# Patient Record
Sex: Male | Born: 1960 | Race: White | Hispanic: No | Marital: Married | Smoking: Never smoker
Health system: Southern US, Community
[De-identification: ages and names within clinical notes are randomized; demographics above are authoritative.]

## PROBLEM LIST (undated history)

## (undated) DIAGNOSIS — I10 Essential (primary) hypertension: Secondary | ICD-10-CM

## (undated) DIAGNOSIS — N200 Calculus of kidney: Secondary | ICD-10-CM

## (undated) DIAGNOSIS — I509 Heart failure, unspecified: Secondary | ICD-10-CM

## (undated) HISTORY — DX: Essential (primary) hypertension: I10

## (undated) HISTORY — PX: LITHOTRIPSY: SUR834

## (undated) HISTORY — PX: LUMBAR DISC SURGERY: SHX700

## (undated) HISTORY — DX: Heart failure, unspecified: I50.9

---

## 2002-08-25 ENCOUNTER — Ambulatory Visit (HOSPITAL_BASED_OUTPATIENT_CLINIC_OR_DEPARTMENT_OTHER): Admission: RE | Admit: 2002-08-25 | Discharge: 2002-08-25 | Payer: Self-pay | Admitting: Urology

## 2002-08-25 ENCOUNTER — Encounter: Payer: Self-pay | Admitting: Urology

## 2002-08-29 ENCOUNTER — Encounter: Payer: Self-pay | Admitting: Urology

## 2002-08-29 ENCOUNTER — Ambulatory Visit (HOSPITAL_BASED_OUTPATIENT_CLINIC_OR_DEPARTMENT_OTHER): Admission: RE | Admit: 2002-08-29 | Discharge: 2002-08-29 | Payer: Self-pay | Admitting: Urology

## 2002-09-01 ENCOUNTER — Ambulatory Visit (HOSPITAL_BASED_OUTPATIENT_CLINIC_OR_DEPARTMENT_OTHER): Admission: RE | Admit: 2002-09-01 | Discharge: 2002-09-01 | Payer: Self-pay | Admitting: Urology

## 2002-09-01 ENCOUNTER — Encounter: Payer: Self-pay | Admitting: Urology

## 2013-05-06 ENCOUNTER — Ambulatory Visit: Payer: Self-pay | Admitting: Internal Medicine

## 2013-07-21 ENCOUNTER — Ambulatory Visit: Payer: Self-pay | Admitting: Surgery

## 2015-04-06 NOTE — Op Note (Signed)
PATIENT NAME:  Philip Romero, [name] MR#:  [id number] DATE OF BIRTH:  1961/12/12  DATE OF PROCEDURE:  07/21/2013  PREOPERATIVE DIAGNOSIS: Left inguinal hernia.   POSTOPERATIVE DIAGNOSIS: Left inguinal hernia.   PROCEDURE: Left inguinal hernia repair.   SURGEON: Renda RollsWilton Smith, MD   ANESTHESIA: General.   INDICATIONS: This 54 year old male has had recent burning and bulging in the left groin, first developed some 2 weeks to prior evaluation, had been doing some strenuous calisthenics. A hernia was demonstrated on physical exam, and repair is recommended for definitive treatment.   DESCRIPTION OF PROCEDURE: The patient was placed on the operating table in the supine position under general anesthesia. The left lower quadrant of the abdomen was clipped and prepared with ChloraPrep and draped in a sterile manner.   A left lower quadrant transversely-oriented suprapubic incision was made, carried down through subcutaneous tissues. One traversing vein was divided between 4-0 chromic suture ligatures. The Scarpa's fascia was incised. Several small bleeding points were cauterized. The external ring was identified. An incision was made in the external oblique aponeurosis along the course of its fibers to open the external ring and expose the inguinal cord structures. The cord structures were mobilized. The floor of the inguinal canal was intact. Cremaster fibers were spread to expose an indirect hernia sac which was approximately 4 inches in length and was dissected up through the internal ring.  It appeared that it was a sliding type hernia and was inverted. Next, there was a cord lipoma which was dissected free from surrounding structures and its pedicle ligated with 4-0 Vicryl suture ligature and amputated, did not need to be sent for pathology. Next, sutures were placed to join the conjoined tendon and the shelving edge of the inguinal ligament beginning at the pubic tubercle with the last stitch leading to  satisfactory narrowing of the internal ring. Next, an [place] [employer] mesh was cut out to create an oval shape of some 2.8 x 4 cm with a notch cut out to straddle the cord structures. The mesh was inserted, was sutured with 0 Surgilon to the shelving edge of the inguinal ligament adjacent to the internal ring and also medial to the internal ring. The mesh was attached to the suture line with additional 0 Surgilon and also sutured medially to the deep fascia. The repair looked good. Hemostasis was intact. Cord structures were replaced along the floor of the anal canal. Cut edges of the external oblique aponeurosis were closed with a running 4-0 Vicryl. The deep fascia superior and lateral to the repair site was infiltrated with 0.5% Sensorcaine with epinephrine. Subcutaneous tissues were infiltrated using a total of 15 mL. The Scarpa's fascia was closed with interrupted 4-0 Vicryl. The skin was closed with running 4-0 Monocryl subcuticular suture and Dermabond. The patient tolerated surgery satisfactorily and was then prepared for transfer to the recovery room.   ____________________________ Shela CommonsJ. Renda RollsWilton Smith, MD jws:cb D: 07/21/2013 13:42:42 ET T: 07/21/2013 21:31:09 ET JOB#: [id number]  cc: Adella HareJ. Wilton Smith, MD, <Dictator> Adella HareWILTON J SMITH MD ELECTRONICALLY SIGNED 07/22/2013 17:52

## 2020-02-29 ENCOUNTER — Emergency Department
Admission: EM | Admit: 2020-02-29 | Discharge: 2020-02-29 | Disposition: A | Discharge status: Home / Self Care | Payer: BC Managed Care – PPO | Attending: Emergency Medicine | Admitting: Emergency Medicine

## 2020-02-29 ENCOUNTER — Other Ambulatory Visit: Payer: Self-pay

## 2020-02-29 ENCOUNTER — Emergency Department: Payer: BC Managed Care – PPO

## 2020-02-29 DIAGNOSIS — F17220 Nicotine dependence, chewing tobacco, uncomplicated: Secondary | ICD-10-CM | POA: Insufficient documentation

## 2020-02-29 DIAGNOSIS — Z79899 Other long term (current) drug therapy: Secondary | ICD-10-CM | POA: Insufficient documentation

## 2020-02-29 DIAGNOSIS — Z20822 Contact with and (suspected) exposure to covid-19: Secondary | ICD-10-CM | POA: Diagnosis not present

## 2020-02-29 DIAGNOSIS — N200 Calculus of kidney: Secondary | ICD-10-CM | POA: Insufficient documentation

## 2020-02-29 DIAGNOSIS — R109 Unspecified abdominal pain: Secondary | ICD-10-CM | POA: Diagnosis present

## 2020-02-29 HISTORY — DX: Calculus of kidney: N20.0

## 2020-02-29 LAB — CBC
HCT: 41.8 % (ref 39.0–52.0)
Hemoglobin: 14.9 g/dL (ref 13.0–17.0)
MCH: 31.6 pg (ref 26.0–34.0)
MCHC: 35.6 g/dL (ref 30.0–36.0)
MCV: 88.6 fL (ref 80.0–100.0)
Platelets: 254 10*3/uL (ref 150–400)
RBC: 4.72 MIL/uL (ref 4.22–5.81)
RDW: 12.1 % (ref 11.5–15.5)
WBC: 7.8 10*3/uL (ref 4.0–10.5)
nRBC: 0 % (ref 0.0–0.2)

## 2020-02-29 LAB — BASIC METABOLIC PANEL
Anion gap: 7 (ref 5–15)
BUN: 22 mg/dL — ABNORMAL HIGH (ref 6–20)
CO2: 30 mmol/L (ref 22–32)
Calcium: 9.5 mg/dL (ref 8.9–10.3)
Chloride: 101 mmol/L (ref 98–111)
Creatinine, Ser: 0.87 mg/dL (ref 0.61–1.24)
GFR calc Af Amer: >60 mL/min (ref >60)
GFR calc non Af Amer: >60 mL/min (ref >60)
Glucose, Bld: 157 mg/dL — ABNORMAL HIGH (ref 70–99)
Potassium: 3.9 mmol/L (ref 3.5–5.1)
Sodium: 138 mmol/L (ref 135–145)

## 2020-02-29 LAB — URINALYSIS, COMPLETE (UACMP) WITH MICROSCOPIC
Bacteria, UA: NONE SEEN
Bilirubin Urine: NEGATIVE
Glucose, UA: NEGATIVE mg/dL
Ketones, ur: NEGATIVE mg/dL
Nitrite: NEGATIVE
Protein, ur: NEGATIVE mg/dL
Specific Gravity, Urine: 1.018 (ref 1.005–1.030)
pH: 5 (ref 5.0–8.0)

## 2020-02-29 MED ORDER — OXYCODONE-ACETAMINOPHEN 5-325 MG PO TABS: 1.0000 | ORAL_TABLET | Freq: Four times a day (QID) | ORAL | 0 refills | Status: DC | PRN

## 2020-02-29 MED ORDER — ONDANSETRON 4 MG PO TBDP: 4.0000 mg | ORAL_TABLET | Freq: Three times a day (TID) | ORAL | 0 refills | Status: DC | PRN

## 2020-02-29 MED ORDER — ONDANSETRON 8 MG PO TBDP
8.0000 mg | ORAL_TABLET | Freq: Once | ORAL | Status: AC
  Administered 2020-02-29: 8 mg via ORAL
  Filled 2020-02-29: qty 1

## 2020-02-29 MED ORDER — OXYCODONE-ACETAMINOPHEN 5-325 MG PO TABS
1.0000 | ORAL_TABLET | ORAL | Status: DC | PRN
  Administered 2020-02-29: 1 via ORAL
  Filled 2020-02-29: qty 1

## 2020-02-29 MED ORDER — TAMSULOSIN HCL 0.4 MG PO CAPS: 0.4000 mg | ORAL_CAPSULE | Freq: Every day | ORAL | 0 refills | Status: DC

## 2020-02-29 MED ORDER — OXYCODONE-ACETAMINOPHEN 5-325 MG PO TABS
1.0000 | ORAL_TABLET | Freq: Once | ORAL | Status: AC
  Administered 2020-02-29: 1 via ORAL
  Filled 2020-02-29: qty 1

## 2020-02-29 NOTE — ED Triage Notes (Signed)
Pt c/o right flank pain radiating into the RLQ  With N/V.Marland Kitchensince last Wednesday with a hx of kidney stones. Pt is in NAD at present.

## 2020-02-29 NOTE — ED Provider Notes (Signed)
[facility] Emergency Department Provider Note  ____________________________________________  Time seen: Approximately 4:43 PM  I have reviewed the triage vital signs and the nursing notes.   HISTORY  Chief Complaint Flank Pain    HPI Philip Romero is a 59 y.o. male who presents the emergency department for evaluation right flank pain.  Patient states that he has a history of kidney stones, however the last time was approximately 15 years ago.  Patient states that he developed flank pain that is now radiating into the right groin.  Positive for hematuria.  Patient states that he has had nausea from the pain, however he is not nauseated when the pain is not bad.  No diarrhea or constipation.  Other than hematuria, no dysuria or polyuria.  Patient states that he has a history of lithotripsy has the stones 15 years ago were too large to pass.  Patient is concerned as he has had symptoms now for 6 days.  No other complaints of URI type symptoms, fevers or chills, chest pain or shortness of breath.         Past Medical History:  Diagnosis Date  . Kidney stone     There are no problems to display for this patient.   Past Surgical History:  Procedure Laterality Date  . LITHOTRIPSY    . LUMBAR DISC SURGERY      Prior to Admission medications   Medication Sig Start Date End Date Taking? Authorizing Provider  ondansetron (ZOFRAN-ODT) 4 MG disintegrating tablet Take 1 tablet (4 mg total) by mouth every 8 (eight) hours as needed for nausea or vomiting. 02/29/20   Pablo Mathurin, Delorise Royals, PA-C  oxyCODONE-acetaminophen (PERCOCET/ROXICET) 5-325 MG tablet Take 1 tablet by mouth every 6 (six) hours as needed for severe pain. 02/29/20   Emmarie Sannes, Delorise Royals, PA-C  tamsulosin (FLOMAX) 0.4 MG CAPS capsule Take 1 capsule (0.4 mg total) by mouth daily. 02/29/20   Koi Yarbro, Delorise Royals, PA-C    Allergies Patient has no known allergies.  No family history on file.  Social  History Social History   Tobacco Use  . Smoking status: Never Smoker  . Smokeless tobacco: Current User    Types: Chew  Substance Use Topics  . Alcohol use: Not Currently  . Drug use: Not Currently     Review of Systems  Constitutional: No fever/chills Eyes: No visual changes. No discharge ENT: No upper respiratory complaints. Cardiovascular: no chest pain. Respiratory: no cough. No SOB. Gastrointestinal: No abdominal pain.  Positive for nausea with increased pain, no baseline nausea, no vomiting.  No diarrhea.  No constipation. Genitourinary: Negative for dysuria.  Positive for right flank pain radiating into the right groin.  Positive for hematuria. Musculoskeletal: Negative for musculoskeletal pain. Skin: Negative for rash, abrasions, lacerations, ecchymosis. Neurological: Negative for headaches, focal weakness or numbness. 10-point ROS otherwise negative.  ____________________________________________   PHYSICAL EXAM:  VITAL SIGNS: ED Triage Vitals  Enc Vitals Group     BP 02/29/20 1436 (!) 161/95     Pulse Rate 02/29/20 1434 87     Resp 02/29/20 1434 17     Temp 02/29/20 1436 98 F (36.7 C)     Temp Source 02/29/20 1434 Oral     SpO2 02/29/20 1434 98 %     Weight 02/29/20 1435 265 lb (120.2 kg)     Height 02/29/20 1435 6\' 2"  (1.88 m)     Head Circumference --      Peak Flow --  Pain Score 02/29/20 1434 3     Pain Loc --      Pain Edu? --      Excl. in [place]? --      Constitutional: Alert and oriented. Well appearing and in no acute distress. Eyes: Conjunctivae are normal. PERRL. EOMI. Head: Atraumatic. ENT:      Ears:       Nose: No congestion/rhinnorhea.      Mouth/Throat: Mucous membranes are moist.  Neck: No stridor.    Cardiovascular: Normal rate, regular rhythm. Normal S1 and S2.  Good peripheral circulation. Respiratory: Normal respiratory effort without tachypnea or retractions. Lungs CTAB. Good air entry to the bases with no decreased or  absent breath sounds. Gastrointestinal: Bowel sounds 4 quadrants. Soft and nontender to palpation all 4 quadrants including suprapubic region. No guarding or rigidity. No palpable masses. No distention. No CVA tenderness. Musculoskeletal: Full range of motion to all extremities. No gross deformities appreciated. Neurologic:  Normal speech and language. No gross focal neurologic deficits are appreciated.  Skin:  Skin is warm, dry and intact. No rash noted. Psychiatric: Mood and affect are normal. Speech and behavior are normal. Patient exhibits appropriate insight and judgement.   ____________________________________________   LABS (all labs ordered are listed, but only abnormal results are displayed)  Labs Reviewed  URINALYSIS, COMPLETE (UACMP) WITH MICROSCOPIC - Abnormal; Notable for the following components:      Result Value   Color, Urine YELLOW (*)    APPearance CLEAR (*)    Hgb urine dipstick MODERATE (*)    Leukocytes,Ua TRACE (*)    All other components within normal limits  BASIC METABOLIC PANEL - Abnormal; Notable for the following components:   Glucose, Bld 157 (*)    BUN 22 (*)    All other components within normal limits  SARS CORONAVIRUS 2 (TAT 6-24 HRS)  CBC   ____________________________________________  EKG   ____________________________________________  RADIOLOGY I personally viewed and evaluated these images as part of my medical decision making, as well as reviewing the written report by the radiologist.  I concur with radiologist finding of obstructing stone in the right ureter with hydronephrosis.  Stones appreciated in bilateral kidneys.  CT Renal Stone Study  Result Date: 02/29/2020 CLINICAL DATA:  Right-sided flank pain. EXAM: CT ABDOMEN AND PELVIS WITHOUT CONTRAST TECHNIQUE: Multidetector CT imaging of the abdomen and pelvis was performed following the standard protocol without IV contrast. COMPARISON:  None. FINDINGS: Lower chest: The lung bases are  clear. The heart size is normal. Hepatobiliary: There is decreased hepatic attenuation suggestive of hepatic steatosis. Cholelithiasis without acute inflammation.There is no biliary ductal dilation. Pancreas: Normal contours without ductal dilatation. No peripancreatic fluid collection. Spleen: No splenic laceration or hematoma. Adrenals/Urinary Tract: --Adrenal glands: No adrenal hemorrhage. --Right kidney/ureter: There is mild to moderate right-sided hydronephrosis secondary to an obstructing stone in the mid right ureter measuring approximately 6 mm (axial series 2, image 59). There are additional nonobstructing stones in the right kidney measuring up to approximately 6 mm in the lower pole. --Left kidney/ureter: Multiple nonobstructing stones are noted in the left kidney measuring up to approximately 1.3 cm. --Urinary bladder: Unremarkable. Stomach/Bowel: --Stomach/Duodenum: No hiatal hernia or other gastric abnormality. Normal duodenal course and caliber. --Small bowel: No dilatation or inflammation. --Colon: There is scattered colonic diverticula without CT evidence of diverticulitis. --Appendix: Normal. Vascular/Lymphatic: Atherosclerotic calcification is present within the non-aneurysmal abdominal aorta, without hemodynamically significant stenosis. --No retroperitoneal lymphadenopathy. --No mesenteric lymphadenopathy. --No pelvic or inguinal lymphadenopathy.  Reproductive: Unremarkable Other: No ascites or free air. There are bilateral fat containing inguinal hernias. Musculoskeletal. No acute displaced fractures. IMPRESSION: 1. Moderate right-sided hydronephrosis secondary to an obstructing 6 mm stone in the mid right ureter. 2. Bilateral nonobstructing nephrolithiasis as detailed above. 3. Hepatic steatosis. 4. Scattered colonic diverticula without CT evidence for diverticulitis. Normal appendix in the right lower quadrant. 5.  Aortic Atherosclerosis (ICD10-I70.0). Electronically Signed   By: Katherine Mantle M.D.   On: 02/29/2020 17:32    ____________________________________________    PROCEDURES  Procedure(s) performed:    Procedures    Medications  oxyCODONE-acetaminophen (PERCOCET/ROXICET) 5-325 MG per tablet 1 tablet (1 tablet Oral Given 02/29/20 1450)  oxyCODONE-acetaminophen (PERCOCET/ROXICET) 5-325 MG per tablet 1 tablet (1 tablet Oral Given 02/29/20 1923)  ondansetron (ZOFRAN-ODT) disintegrating tablet 8 mg (8 mg Oral Given 02/29/20 1923)     ____________________________________________   INITIAL IMPRESSION / ASSESSMENT AND PLAN / ED COURSE  Pertinent labs & imaging results that were available during my care of the patient were reviewed by me and considered in my medical decision making (see chart for details).  Review of the [place] CSRS was performed in accordance of the [facility] prior to dispensing any controlled drugs.           Patient's diagnosis is consistent with right side obstructing nephrolithiasis.  Patient presented to the emergency department with 1 week of right-sided flank pain.  Patient does have a history of remote nephrolithiasis that required lithotripsy.  This occurred roughly 15 years ago.  On exam, differential included pyelonephritis, appendicitis, cholecystitis, nephrolithiasis, obstructing nephrolithiasis.  CT scan reveals 6 mm stone lodged mid ureter.  I discussed the case with on-call urologist, Dr. Apolinar Junes.  Patient will have lithotripsy tomorrow.  Patient is discharged with pain medication, antimedics, Flomax for use after lithotripsy.  Patient will follow up with urology in the morning for lithotripsy.  Return precautions discussed with the patient.   Patient is given ED precautions to return to the ED for any worsening or new symptoms.     ____________________________________________  FINAL CLINICAL IMPRESSION(S) / ED DIAGNOSES  Final diagnoses:  Nephrolithiasis      NEW MEDICATIONS STARTED DURING THIS VISIT:  ED Discharge Orders          Ordered    oxyCODONE-acetaminophen (PERCOCET/ROXICET) 5-325 MG tablet  Every 6 hours PRN     02/29/20 1917    ondansetron (ZOFRAN-ODT) 4 MG disintegrating tablet  Every 8 hours PRN     02/29/20 1917    tamsulosin (FLOMAX) 0.4 MG CAPS capsule  Daily     02/29/20 1917              This chart was dictated using voice recognition software/Dragon. Despite best efforts to proofread, errors can occur which can change the meaning. Any change was purely unintentional.    Racheal Patches, PA-C 02/29/20 1936    Sharman Cheek, MD 02/29/20 2253

## 2020-03-01 ENCOUNTER — Ambulatory Visit
Admission: AD | Admit: 2020-03-01 | Discharge: 2020-03-01 | Disposition: A | Discharge status: Home / Self Care | Payer: BC Managed Care – PPO | Source: Ambulatory Visit | Attending: Urology | Admitting: Urology

## 2020-03-01 ENCOUNTER — Other Ambulatory Visit: Payer: Self-pay | Admitting: Urology

## 2020-03-01 ENCOUNTER — Ambulatory Visit (INDEPENDENT_AMBULATORY_CARE_PROVIDER_SITE_OTHER): Payer: Self-pay | Admitting: Urology

## 2020-03-01 ENCOUNTER — Encounter: Payer: Self-pay | Admitting: Urology

## 2020-03-01 ENCOUNTER — Ambulatory Visit: Admit: 2020-03-01 | Payer: BC Managed Care – PPO | Admitting: Urology

## 2020-03-01 ENCOUNTER — Other Ambulatory Visit: Payer: Self-pay

## 2020-03-01 ENCOUNTER — Encounter
Admission: AD | Disposition: A | Discharge status: Home / Self Care | Payer: Self-pay | Source: Ambulatory Visit | Attending: Urology

## 2020-03-01 VITALS — BP 151/100 | HR 81 | Ht 74.0 in | Wt 263.0 lb

## 2020-03-01 DIAGNOSIS — K760 Fatty (change of) liver, not elsewhere classified: Secondary | ICD-10-CM | POA: Insufficient documentation

## 2020-03-01 DIAGNOSIS — N201 Calculus of ureter: Secondary | ICD-10-CM

## 2020-03-01 DIAGNOSIS — Z87442 Personal history of urinary calculi: Secondary | ICD-10-CM | POA: Diagnosis not present

## 2020-03-01 DIAGNOSIS — N200 Calculus of kidney: Secondary | ICD-10-CM

## 2020-03-01 DIAGNOSIS — Z79899 Other long term (current) drug therapy: Secondary | ICD-10-CM | POA: Insufficient documentation

## 2020-03-01 DIAGNOSIS — K5730 Diverticulosis of large intestine without perforation or abscess without bleeding: Secondary | ICD-10-CM | POA: Diagnosis not present

## 2020-03-01 DIAGNOSIS — I700 Atherosclerosis of aorta: Secondary | ICD-10-CM | POA: Diagnosis not present

## 2020-03-01 DIAGNOSIS — N132 Hydronephrosis with renal and ureteral calculous obstruction: Secondary | ICD-10-CM | POA: Insufficient documentation

## 2020-03-01 DIAGNOSIS — R109 Unspecified abdominal pain: Secondary | ICD-10-CM

## 2020-03-01 HISTORY — PX: EXTRACORPOREAL SHOCK WAVE LITHOTRIPSY: SHX1557

## 2020-03-01 LAB — SARS CORONAVIRUS 2 (TAT 6-24 HRS): SARS Coronavirus 2: NEGATIVE

## 2020-03-01 SURGERY — LITHOTRIPSY, ESWL
Anesthesia: Choice | Laterality: Right

## 2020-03-01 SURGERY — LITHOTRIPSY, ESWL
Anesthesia: Moderate Sedation | Laterality: Right

## 2020-03-01 MED ORDER — CIPROFLOXACIN HCL 500 MG PO TABS
ORAL_TABLET | ORAL | Status: AC
  Administered 2020-03-01: 500 mg via ORAL
  Filled 2020-03-01: qty 1

## 2020-03-01 MED ORDER — DIAZEPAM 5 MG PO TABS
ORAL_TABLET | ORAL | Status: AC
  Administered 2020-03-01: 10 mg via ORAL
  Filled 2020-03-01: qty 2

## 2020-03-01 MED ORDER — DIPHENHYDRAMINE HCL 25 MG PO CAPS
ORAL_CAPSULE | ORAL | Status: AC
  Administered 2020-03-01: 25 mg via ORAL
  Filled 2020-03-01: qty 1

## 2020-03-01 MED ORDER — DIPHENHYDRAMINE HCL 25 MG PO CAPS: 25.0000 mg | ORAL_CAPSULE | ORAL | Status: AC

## 2020-03-01 MED ORDER — ONDANSETRON HCL 4 MG/2ML IJ SOLN
INTRAMUSCULAR | Status: AC
  Administered 2020-03-01: 4 mg via INTRAVENOUS
  Filled 2020-03-01: qty 2

## 2020-03-01 MED ORDER — ONDANSETRON HCL 4 MG/2ML IJ SOLN: 4.0000 mg | Freq: Once | INTRAMUSCULAR | Status: AC

## 2020-03-01 MED ORDER — CIPROFLOXACIN HCL 500 MG PO TABS: 500.0000 mg | ORAL_TABLET | ORAL | Status: AC

## 2020-03-01 MED ORDER — DIAZEPAM 5 MG PO TABS: 10.0000 mg | ORAL_TABLET | ORAL | Status: AC

## 2020-03-01 MED ORDER — SODIUM CHLORIDE 0.9 % IV SOLN: INTRAVENOUS | Status: DC

## 2020-03-01 NOTE — Discharge Instructions (Signed)
See [facility] discharge instructions in chart.  

## 2020-03-01 NOTE — Progress Notes (Signed)
03/01/2020 9:39 AM   Philip Romero [birth year] [id number]  Referring provider: No referring provider defined for this encounter.  Chief Complaint  Patient presents with  . Nephrolithiasis    HPI: 59 yo M with personal history of kidney stones who presented to the emergency see room last night with worsening right flank pain.    CT revealed a 6 mm right mid ureteral calculus with proximal hydroureteronephrosis. Is additional nonobstructing stones, left greater than right measuring up to 1.3 cm.  He does have a personal history of nephrolithiasis and undergone successful ESWL in the past.  He denies any fevers, chills, nausea or vomiting. No dysuria or gross hematuria.  In the emergency room, no evidence of infection or leukocytosis. Creatinine unremarkable.  PMH: Past Medical History:  Diagnosis Date  . Kidney stone     Surgical History: Past Surgical History:  Procedure Laterality Date  . LITHOTRIPSY    . LUMBAR DISC SURGERY      Home Medications:  Allergies as of 03/01/2020   No Known Allergies     Medication List       Accurate as of March 01, 2020  9:17 AM. If you have any questions, ask your nurse or doctor.        ondansetron 4 MG disintegrating tablet Commonly known as: ZOFRAN-ODT Take 1 tablet (4 mg total) by mouth every 8 (eight) hours as needed for nausea or vomiting.   oxyCODONE-acetaminophen 5-325 MG tablet Commonly known as: PERCOCET/ROXICET Take 1 tablet by mouth every 6 (six) hours as needed for severe pain.   tamsulosin 0.4 MG Caps capsule Commonly known as: FLOMAX Take 1 capsule (0.4 mg total) by mouth daily.       Allergies: No Known Allergies  Family History: No family history on file.  Social History:  reports that he has never smoked. His smokeless tobacco use includes chew. He reports previous alcohol use. He reports previous drug use.   Physical Exam: BP (!) 151/100   Pulse 81   Ht 6\' 2"  (1.88 m)   Wt 263 lb (119.3  kg)   BMI 33.77 kg/m   Constitutional:  Alert and oriented, No acute distress. HEENT: [place] AT, moist mucus membranes.  Trachea midline, no masses. Cardiovascular: No clubbing, cyanosis, or edema. Respiratory: Normal respiratory effort, no increased work of breathing. GI: Abdomen is soft, nontender, nondistended, no abdominal masses GU: No CVA tenderness Skin: No rashes, bruises or suspicious lesions. Neurologic: Grossly intact, no focal deficits, moving all 4 extremities. Psychiatric: Normal mood and affect.  Laboratory Data: Lab Results  Component Value Date   WBC 7.8 02/29/2020   HGB 14.9 02/29/2020   HCT 41.8 02/29/2020   MCV 88.6 02/29/2020   PLT 254 02/29/2020    Lab Results  Component Value Date   CREATININE 0.87 02/29/2020    Urinalysis Component     Latest Ref Rng & Units 02/29/2020  Color, Urine     YELLOW YELLOW (A)  Appearance     CLEAR CLEAR (A)  Specific Gravity, Urine     1.005 - 1.030 1.018  pH     5.0 - 8.0 5.0  Glucose, UA     NEGATIVE mg/dL NEGATIVE  Hgb urine dipstick     NEGATIVE MODERATE (A)  Bilirubin Urine     NEGATIVE NEGATIVE  Ketones, ur     NEGATIVE mg/dL NEGATIVE  Protein     NEGATIVE mg/dL NEGATIVE  Nitrite     NEGATIVE NEGATIVE  Leukocytes,Ua  NEGATIVE TRACE (A)  RBC / HPF     0 - 5 RBC/hpf 6-10  WBC, UA     0 - 5 WBC/hpf 11-20  Bacteria, UA     NONE SEEN NONE SEEN  Squamous Epithelial / LPF     0 - 5 0-5  Mucus      PRESENT    Pertinent Imaging: Results for orders placed during the hospital encounter of 02/29/20  CT Renal Stone Study   Narrative CLINICAL DATA:  Right-sided flank pain.  EXAM: CT ABDOMEN AND PELVIS WITHOUT CONTRAST  TECHNIQUE: Multidetector CT imaging of the abdomen and pelvis was performed following the standard protocol without IV contrast.  COMPARISON:  None.  FINDINGS: Lower chest: The lung bases are clear. The heart size is normal.  Hepatobiliary: There is decreased hepatic attenuation  suggestive of hepatic steatosis. Cholelithiasis without acute inflammation.There is no biliary ductal dilation.  Pancreas: Normal contours without ductal dilatation. No peripancreatic fluid collection.  Spleen: No splenic laceration or hematoma.  Adrenals/Urinary Tract:  --Adrenal glands: No adrenal hemorrhage.  --Right kidney/ureter: There is mild to moderate right-sided hydronephrosis secondary to an obstructing stone in the mid right ureter measuring approximately 6 mm (axial series 2, image 59). There are additional nonobstructing stones in the right kidney measuring up to approximately 6 mm in the lower pole.  --Left kidney/ureter: Multiple nonobstructing stones are noted in the left kidney measuring up to approximately 1.3 cm.  --Urinary bladder: Unremarkable.  Stomach/Bowel:  --Stomach/Duodenum: No hiatal hernia or other gastric abnormality. Normal duodenal course and caliber.  --Small bowel: No dilatation or inflammation.  --Colon: There is scattered colonic diverticula without CT evidence of diverticulitis.  --Appendix: Normal.  Vascular/Lymphatic: Atherosclerotic calcification is present within the non-aneurysmal abdominal aorta, without hemodynamically significant stenosis.  --No retroperitoneal lymphadenopathy.  --No mesenteric lymphadenopathy.  --No pelvic or inguinal lymphadenopathy.  Reproductive: Unremarkable  Other: No ascites or free air. There are bilateral fat containing inguinal hernias.  Musculoskeletal. No acute displaced fractures.  IMPRESSION: 1. Moderate right-sided hydronephrosis secondary to an obstructing 6 mm stone in the mid right ureter. 2. Bilateral nonobstructing nephrolithiasis as detailed above. 3. Hepatic steatosis. 4. Scattered colonic diverticula without CT evidence for diverticulitis. Normal appendix in the right lower quadrant. 5.  Aortic Atherosclerosis (ICD10-I70.0).   Electronically Signed   By: Christopher   Green M.D.   On: 02/29/2020 17:32    CT scan personally reviewed, agree with radiologic interpretation  Assessment & Plan:    1. Right ureteral stone Discussed management of his 6 mm ureteral calculus  Given the size and location, he does have a chance of passing spontaneously. Given that he is been dealing with pain over a week, he desires intervention.  We discussed various treatment options including ESWL vs. ureteroscopy, laser lithotripsy, and stent. We discussed the risks and benefits of both including bleeding, infection, damage to surrounding structures, efficacy with need for possible further intervention, and need for temporary ureteral stent.  He would like to pursue right ESWL today. Is currently n.p.o. Covid test test was negative. Efficacy rate discussed. All questions answered.  2. Right flank pain Secondary #1  3. Bilateral kidney stones Patient may be interested in prophylactic treatment for larger kidney stones  We'll plan to discuss this further at his follow-up.    Minha Fulco, MD  [facility] [address], [address] [place], [place] [address] [phone]  

## 2020-03-01 NOTE — Interval H&P Note (Signed)
History and Physical Interval Note:  03/01/2020 10:18 AM  Philip Romero  has presented today for surgery, with the diagnosis of Kidney stone.  The various methods of treatment have been discussed with the patient and family. After consideration of risks, benefits and other options for treatment, the patient has consented to  Procedure(s): EXTRACORPOREAL SHOCK WAVE LITHOTRIPSY (ESWL) (Right) as a surgical intervention.  The patient's history has been reviewed, patient examined, no change in status, stable for surgery.  I have reviewed the patient's chart and labs.  Questions were answered to the patient's satisfaction.     [name]

## 2020-03-01 NOTE — H&P (View-Only) (Signed)
03/01/2020 9:39 AM   Philip Romero [birth year] [id number]  Referring provider: No referring provider defined for this encounter.  Chief Complaint  Patient presents with  . Nephrolithiasis    HPI: 59 yo M with personal history of kidney stones who presented to the emergency see room last night with worsening right flank pain.    CT revealed a 6 mm right mid ureteral calculus with proximal hydroureteronephrosis. Is additional nonobstructing stones, left greater than right measuring up to 1.3 cm.  He does have a personal history of nephrolithiasis and undergone successful ESWL in the past.  He denies any fevers, chills, nausea or vomiting. No dysuria or gross hematuria.  In the emergency room, no evidence of infection or leukocytosis. Creatinine unremarkable.  PMH: Past Medical History:  Diagnosis Date  . Kidney stone     Surgical History: Past Surgical History:  Procedure Laterality Date  . LITHOTRIPSY    . LUMBAR DISC SURGERY      Home Medications:  Allergies as of 03/01/2020   No Known Allergies     Medication List       Accurate as of March 01, 2020  9:17 AM. If you have any questions, ask your nurse or doctor.        ondansetron 4 MG disintegrating tablet Commonly known as: ZOFRAN-ODT Take 1 tablet (4 mg total) by mouth every 8 (eight) hours as needed for nausea or vomiting.   oxyCODONE-acetaminophen 5-325 MG tablet Commonly known as: PERCOCET/ROXICET Take 1 tablet by mouth every 6 (six) hours as needed for severe pain.   tamsulosin 0.4 MG Caps capsule Commonly known as: FLOMAX Take 1 capsule (0.4 mg total) by mouth daily.       Allergies: No Known Allergies  Family History: No family history on file.  Social History:  reports that he has never smoked. His smokeless tobacco use includes chew. He reports previous alcohol use. He reports previous drug use.   Physical Exam: BP (!) 151/100   Pulse 81   Ht 6\' 2"  (1.88 m)   Wt 263 lb (119.3  kg)   BMI 33.77 kg/m   Constitutional:  Alert and oriented, No acute distress. HEENT: [place] AT, moist mucus membranes.  Trachea midline, no masses. Cardiovascular: No clubbing, cyanosis, or edema. Respiratory: Normal respiratory effort, no increased work of breathing. GI: Abdomen is soft, nontender, nondistended, no abdominal masses GU: No CVA tenderness Skin: No rashes, bruises or suspicious lesions. Neurologic: Grossly intact, no focal deficits, moving all 4 extremities. Psychiatric: Normal mood and affect.  Laboratory Data: Lab Results  Component Value Date   WBC 7.8 02/29/2020   HGB 14.9 02/29/2020   HCT 41.8 02/29/2020   MCV 88.6 02/29/2020   PLT 254 02/29/2020    Lab Results  Component Value Date   CREATININE 0.87 02/29/2020    Urinalysis Component     Latest Ref Rng & Units 02/29/2020  Color, Urine     YELLOW YELLOW (A)  Appearance     CLEAR CLEAR (A)  Specific Gravity, Urine     1.005 - 1.030 1.018  pH     5.0 - 8.0 5.0  Glucose, UA     NEGATIVE mg/dL NEGATIVE  Hgb urine dipstick     NEGATIVE MODERATE (A)  Bilirubin Urine     NEGATIVE NEGATIVE  Ketones, ur     NEGATIVE mg/dL NEGATIVE  Protein     NEGATIVE mg/dL NEGATIVE  Nitrite     NEGATIVE NEGATIVE  Leukocytes,Ua  NEGATIVE TRACE (A)  RBC / HPF     0 - 5 RBC/hpf 6-10  WBC, UA     0 - 5 WBC/hpf 11-20  Bacteria, UA     NONE SEEN NONE SEEN  Squamous Epithelial / LPF     0 - 5 0-5  Mucus      PRESENT    Pertinent Imaging: Results for orders placed during the hospital encounter of 02/29/20  CT Renal Stone Study   Narrative CLINICAL DATA:  Right-sided flank pain.  EXAM: CT ABDOMEN AND PELVIS WITHOUT CONTRAST  TECHNIQUE: Multidetector CT imaging of the abdomen and pelvis was performed following the standard protocol without IV contrast.  COMPARISON:  None.  FINDINGS: Lower chest: The lung bases are clear. The heart size is normal.  Hepatobiliary: There is decreased hepatic attenuation  suggestive of hepatic steatosis. Cholelithiasis without acute inflammation.There is no biliary ductal dilation.  Pancreas: Normal contours without ductal dilatation. No peripancreatic fluid collection.  Spleen: No splenic laceration or hematoma.  Adrenals/Urinary Tract:  --Adrenal glands: No adrenal hemorrhage.  --Right kidney/ureter: There is mild to moderate right-sided hydronephrosis secondary to an obstructing stone in the mid right ureter measuring approximately 6 mm (axial series 2, image 59). There are additional nonobstructing stones in the right kidney measuring up to approximately 6 mm in the lower pole.  --Left kidney/ureter: Multiple nonobstructing stones are noted in the left kidney measuring up to approximately 1.3 cm.  --Urinary bladder: Unremarkable.  Stomach/Bowel:  --Stomach/Duodenum: No hiatal hernia or other gastric abnormality. Normal duodenal course and caliber.  --Small bowel: No dilatation or inflammation.  --Colon: There is scattered colonic diverticula without CT evidence of diverticulitis.  --Appendix: Normal.  Vascular/Lymphatic: Atherosclerotic calcification is present within the non-aneurysmal abdominal aorta, without hemodynamically significant stenosis.  --No retroperitoneal lymphadenopathy.  --No mesenteric lymphadenopathy.  --No pelvic or inguinal lymphadenopathy.  Reproductive: Unremarkable  Other: No ascites or free air. There are bilateral fat containing inguinal hernias.  Musculoskeletal. No acute displaced fractures.  IMPRESSION: 1. Moderate right-sided hydronephrosis secondary to an obstructing 6 mm stone in the mid right ureter. 2. Bilateral nonobstructing nephrolithiasis as detailed above. 3. Hepatic steatosis. 4. Scattered colonic diverticula without CT evidence for diverticulitis. Normal appendix in the right lower quadrant. 5.  Aortic Atherosclerosis (ICD10-I70.0).   Electronically Signed   By: Constance Holster M.D.   On: 02/29/2020 17:32    CT scan personally reviewed, agree with radiologic interpretation  Assessment & Plan:    1. Right ureteral stone Discussed management of his 6 mm ureteral calculus  Given the size and location, he does have a chance of passing spontaneously. Given that he is been dealing with pain over a week, he desires intervention.  We discussed various treatment options including ESWL vs. ureteroscopy, laser lithotripsy, and stent. We discussed the risks and benefits of both including bleeding, infection, damage to surrounding structures, efficacy with need for possible further intervention, and need for temporary ureteral stent.  He would like to pursue right ESWL today. Is currently n.p.o. Covid test test was negative. Efficacy rate discussed. All questions answered.  2. Right flank pain Secondary #1  3. Bilateral kidney stones Patient may be interested in prophylactic treatment for larger kidney stones  We'll plan to discuss this further at his follow-up.    Hollice Espy, MD  [facility] [address], [place],  [address] [phone]

## 2020-03-15 ENCOUNTER — Ambulatory Visit (INDEPENDENT_AMBULATORY_CARE_PROVIDER_SITE_OTHER): Payer: BC Managed Care – PPO | Admitting: Physician Assistant

## 2020-03-15 ENCOUNTER — Ambulatory Visit
Admission: RE | Admit: 2020-03-15 | Discharge: 2020-03-15 | Disposition: A | Discharge status: Home / Self Care | Payer: BC Managed Care – PPO | Source: Ambulatory Visit | Attending: Urology | Admitting: Urology

## 2020-03-15 ENCOUNTER — Encounter: Payer: Self-pay | Admitting: Physician Assistant

## 2020-03-15 ENCOUNTER — Other Ambulatory Visit: Payer: Self-pay

## 2020-03-15 VITALS — BP 162/89 | HR 93 | Ht 74.0 in | Wt 263.0 lb

## 2020-03-15 DIAGNOSIS — N201 Calculus of ureter: Secondary | ICD-10-CM | POA: Insufficient documentation

## 2020-03-15 LAB — MICROSCOPIC EXAMINATION

## 2020-03-15 LAB — URINALYSIS, COMPLETE
Bilirubin, UA: NEGATIVE
Glucose, UA: NEGATIVE
Ketones, UA: NEGATIVE
Leukocytes,UA: NEGATIVE
Nitrite, UA: NEGATIVE
Protein,UA: NEGATIVE
Specific Gravity, UA: 1.025 (ref 1.005–1.030)
Urobilinogen, Ur: 0.2 mg/dL (ref 0.2–1.0)
pH, UA: 5.5 (ref 5.0–7.5)

## 2020-03-15 NOTE — Progress Notes (Signed)
03/15/2020 9:48 AM   Philip Romero 1961/05/25 [id number]  CC: Postop ESWL  HPI: Philip Romero is a 59 y.o. male who presents for postoperative evaluation s/p ESWL with Dr. Apolinar Junes on 03/01/2020 for management of a 6 mm mid right ureteral stone.  Intraoperative impression unavailable.  He had originally presented to the ED the day prior reporting a 1 week history of right flank pain, nausea, and vomiting.  CT stone study at that visit revealed the right ureteral stone as well as bilateral nephrolithiasis L>R that was not treated during shockwave.  He was discharged from the ED on Flomax, Percocet, and Zofran.  Today, he reports symptom resolution since the procedure. He denies fever, chills, nausea, vomiting, flank pain, and gross hematuria. He strain his urine for approximately 10 days following the procedure and was not Romero to capture any fragments.  Patient reports infrequent nephrolithiasis, most recently undergoing ESWL in 2003. 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 He took potassium citrate following this for approximately 1 year but states that he stopped it thereafter as he was not sure whether or not it was helping. He does make an effort to increase his fluid intake.  Follow-up KUB today revealed bilateral renal stones with apparent clearance of the mid right ureteral stone.  In-office UA today positive for 3+ blood; urine microscopy with 6-10 WBCs/HP and, 11-30 RBCs/HPF.   PMH: Past Medical History:  Diagnosis Date  . Kidney stone     Surgical History: Past Surgical History:  Procedure Laterality Date  . EXTRACORPOREAL SHOCK WAVE LITHOTRIPSY Right 03/01/2020   Procedure: EXTRACORPOREAL SHOCK WAVE LITHOTRIPSY (ESWL);  Surgeon: [name], MD;  Location: [facility] ORS;  Service: Urology;  Laterality: Right;  . LITHOTRIPSY    . LUMBAR DISC SURGERY      Home Medications:  Allergies as of 03/15/2020   No Known Allergies     Medication List       Accurate as of March 15, 2020  9:48 AM. If you have any questions, ask your nurse or doctor.        ondansetron 4 MG disintegrating tablet Commonly known as: ZOFRAN-ODT Take 1 tablet (4 mg total) by mouth every 8 (eight) hours as needed for nausea or vomiting.   oxyCODONE-acetaminophen 5-325 MG tablet Commonly known as: PERCOCET/ROXICET Take 1 tablet by mouth every 6 (six) hours as needed for severe pain.   tamsulosin 0.4 MG Caps capsule Commonly known as: FLOMAX Take 1 capsule (0.4 mg total) by mouth daily.       Allergies: No Known Allergies  Family History: No family history on file.  Social History:  reports that he has never smoked. His smokeless tobacco use includes chew. He reports previous alcohol use. He reports previous drug use.  Physical Exam: BP (!) 162/89   Pulse 93   Ht 6\' 2"  (1.88 m)   Wt 263 lb (119.3 kg)   BMI 33.77 kg/m   Constitutional:  Alert and oriented, No acute distress. HEENT: [place] AT, moist mucus membranes.  Trachea midline, no masses. Cardiovascular: No clubbing, cyanosis, or edema. Respiratory: Normal respiratory effort, no increased work of breathing. GI: Abdomen is soft, nontender, nondistended, no abdominal masses GU: No CVA tenderness Lymph: No cervical or inguinal lymphadenopathy. Skin: No rashes, bruises or suspicious lesions. Neurologic: Grossly intact, no focal deficits, moving all 4 extremities. Psychiatric: Normal mood and affect.  Laboratory Data: Results for orders placed or performed in visit on 03/15/20  Microscopic Examination  URINE  Result Value Ref Range   WBC, UA 6-10 (A) 0 - 5 /hpf   RBC 11-30 (A) 0 - 2 /hpf   Epithelial Cells (non renal) 0-10 0 - 10 /hpf   Casts Present (A) None seen /lpf   Cast Type Hyaline casts N/A   Mucus, UA Present (A) Not Estab.   Bacteria, UA Few None seen/Few  Urinalysis, Complete  Result Value Ref Range   Specific Gravity, UA 1.025 1.005 - 1.030   pH, UA 5.5 5.0 - 7.5   Color, UA  Yellow Yellow   Appearance Ur Cloudy (A) Clear   Leukocytes,UA Negative Negative   Protein,UA Negative Negative/Trace   Glucose, UA Negative Negative   Ketones, UA Negative Negative   RBC, UA 3+ (A) Negative   Bilirubin, UA Negative Negative   Urobilinogen, Ur 0.2 0.2 - 1.0 mg/dL   Nitrite, UA Negative Negative   Microscopic Examination See below:    Pertinent Imaging: Results for orders placed during the hospital encounter of 03/15/20  DG Abd 1 View   Narrative CLINICAL DATA:  Recent lithotripsy for right-sided calculus  EXAM: ABDOMEN - 1 VIEW  COMPARISON:  CT abdomen and pelvis February 29, 2020  FINDINGS: There is a calculus in the lower pole left kidney measuring 1.1 x 1.0 cm. Slightly superior to this calculus, there is a calculus in the mid to lower left kidney measuring 1.0 x 0.9 cm. There is a calculus in the region of the lower pole the right kidney measuring 0.7 x 0.5 cm. The previously noted calculus in the region of the right ureter is no longer evident. There are apparent phleboliths in the pelvis.  There is no bowel dilatation or air-fluid level to suggest bowel obstruction. No free air. There is moderate stool in the colon. Lung bases are clear.  IMPRESSION: Intrarenal calculi evident. The previously noted right ureteral calculus is no longer evident by radiography.  No bowel obstruction.  No evident free air.   Electronically Signed   By: Lowella Grip III M.D.   On: 03/15/2020 09:23    I personally reviewed the images referenced above and note bilateral nephrolithiasis with no obvious residual ureteral calculus.  Assessment & Plan:   1. Right ureteral stone 59 year old male s/p ESWL for management of a 6 mm mid right ureteral stone with known nonobstructing bilateral nephrolithiasis. UA today notable for mild pyuria and microscopic hematuria consistent with continued passage of stone fragments.  Counseled patient to continue his prescription for  Flomax and follow-up in 4 weeks with a lab visit for repeat UA to prove resolution of the above. Reviewed warning signs with him today and counseled him to contact our office immediately or proceed to the emergency room if he develops acute onset intractable flank pain, nausea, vomiting, fever, or chills. He expressed understanding.  Patient would like to defer proactive intervention for his known nonobstructing stones and continue to see [place] on an as-needed basis. I am in agreement with this plan. - Urinalysis, Complete   Return in about 4 weeks (around 04/12/2020) for Lab visit for UA.  Debroah Loop, PA-C  [facility] [address] [place], [place] [address] [phone]

## 2020-04-18 ENCOUNTER — Other Ambulatory Visit: Payer: Self-pay

## 2020-04-18 DIAGNOSIS — N200 Calculus of kidney: Secondary | ICD-10-CM

## 2020-04-19 ENCOUNTER — Encounter: Payer: Self-pay | Admitting: Urology

## 2020-04-19 ENCOUNTER — Other Ambulatory Visit: Payer: Self-pay

## 2021-05-28 ENCOUNTER — Other Ambulatory Visit: Payer: Self-pay

## 2021-05-28 ENCOUNTER — Inpatient Hospital Stay (HOSPITAL_COMMUNITY)
Admit: 2021-05-28 | Discharge: 2021-05-28 | Disposition: A | Discharge status: Home / Self Care | Payer: No Typology Code available for payment source | Attending: Internal Medicine | Admitting: Internal Medicine

## 2021-05-28 ENCOUNTER — Emergency Department: Payer: No Typology Code available for payment source

## 2021-05-28 ENCOUNTER — Inpatient Hospital Stay
Admission: EM | Admit: 2021-05-28 | Discharge: 2021-05-30 | DRG: 193 | Disposition: A | Discharge status: Home / Self Care | Payer: No Typology Code available for payment source | Attending: Internal Medicine | Admitting: Internal Medicine

## 2021-05-28 ENCOUNTER — Encounter: Payer: Self-pay | Admitting: Emergency Medicine

## 2021-05-28 DIAGNOSIS — I5031 Acute diastolic (congestive) heart failure: Secondary | ICD-10-CM | POA: Diagnosis present

## 2021-05-28 DIAGNOSIS — R778 Other specified abnormalities of plasma proteins: Secondary | ICD-10-CM | POA: Diagnosis present

## 2021-05-28 DIAGNOSIS — R000 Tachycardia, unspecified: Secondary | ICD-10-CM | POA: Diagnosis present

## 2021-05-28 DIAGNOSIS — I712 Thoracic aortic aneurysm, without rupture: Secondary | ICD-10-CM | POA: Diagnosis present

## 2021-05-28 DIAGNOSIS — B9789 Other viral agents as the cause of diseases classified elsewhere: Secondary | ICD-10-CM | POA: Diagnosis present

## 2021-05-28 DIAGNOSIS — A419 Sepsis, unspecified organism: Secondary | ICD-10-CM

## 2021-05-28 DIAGNOSIS — I248 Other forms of acute ischemic heart disease: Secondary | ICD-10-CM | POA: Diagnosis present

## 2021-05-28 DIAGNOSIS — J9600 Acute respiratory failure, unspecified whether with hypoxia or hypercapnia: Secondary | ICD-10-CM | POA: Diagnosis present

## 2021-05-28 DIAGNOSIS — Z20822 Contact with and (suspected) exposure to covid-19: Secondary | ICD-10-CM | POA: Diagnosis present

## 2021-05-28 DIAGNOSIS — J9801 Acute bronchospasm: Secondary | ICD-10-CM | POA: Diagnosis present

## 2021-05-28 DIAGNOSIS — I160 Hypertensive urgency: Secondary | ICD-10-CM | POA: Diagnosis present

## 2021-05-28 DIAGNOSIS — R7989 Other specified abnormal findings of blood chemistry: Secondary | ICD-10-CM | POA: Diagnosis present

## 2021-05-28 DIAGNOSIS — J159 Unspecified bacterial pneumonia: Secondary | ICD-10-CM | POA: Diagnosis present

## 2021-05-28 DIAGNOSIS — I161 Hypertensive emergency: Secondary | ICD-10-CM | POA: Diagnosis present

## 2021-05-28 DIAGNOSIS — I110 Hypertensive heart disease with heart failure: Secondary | ICD-10-CM | POA: Diagnosis present

## 2021-05-28 DIAGNOSIS — J129 Viral pneumonia, unspecified: Principal | ICD-10-CM | POA: Diagnosis present

## 2021-05-28 DIAGNOSIS — I509 Heart failure, unspecified: Secondary | ICD-10-CM

## 2021-05-28 DIAGNOSIS — J189 Pneumonia, unspecified organism: Secondary | ICD-10-CM

## 2021-05-28 DIAGNOSIS — Z79899 Other long term (current) drug therapy: Secondary | ICD-10-CM

## 2021-05-28 DIAGNOSIS — J9601 Acute respiratory failure with hypoxia: Secondary | ICD-10-CM

## 2021-05-28 LAB — URINALYSIS, COMPLETE (UACMP) WITH MICROSCOPIC
Bilirubin Urine: NEGATIVE
Glucose, UA: NEGATIVE mg/dL
Ketones, ur: NEGATIVE mg/dL
Nitrite: NEGATIVE
Protein, ur: NEGATIVE mg/dL
Specific Gravity, Urine: 1.04 — ABNORMAL HIGH (ref 1.005–1.030)
Squamous Epithelial / HPF: NONE SEEN (ref 0–5)
pH: 5 (ref 5.0–8.0)

## 2021-05-28 LAB — LACTIC ACID, PLASMA
Lactic Acid, Venous: 2.2 mmol/L (ref 0.5–1.9)
Lactic Acid, Venous: 1.2 mmol/L (ref 0.5–1.9)

## 2021-05-28 LAB — CBC WITH DIFFERENTIAL/PLATELET
Abs Immature Granulocytes: 0.03 10*3/uL (ref 0.00–0.07)
Basophils Absolute: 0.1 10*3/uL (ref 0.0–0.1)
Basophils Relative: 1 %
Eosinophils Absolute: 0 10*3/uL (ref 0.0–0.5)
Eosinophils Relative: 0 %
HCT: 40.5 % (ref 39.0–52.0)
Hemoglobin: 14.1 g/dL (ref 13.0–17.0)
Immature Granulocytes: 0 %
Lymphocytes Relative: 7 %
Lymphs Abs: 1 10*3/uL (ref 0.7–4.0)
MCH: 31.5 pg (ref 26.0–34.0)
MCHC: 34.8 g/dL (ref 30.0–36.0)
MCV: 90.4 fL (ref 80.0–100.0)
Monocytes Absolute: 0.9 10*3/uL (ref 0.1–1.0)
Monocytes Relative: 7 %
Neutro Abs: 11.3 10*3/uL — ABNORMAL HIGH (ref 1.7–7.7)
Neutrophils Relative %: 85 %
Platelets: 240 10*3/uL (ref 150–400)
RBC: 4.48 MIL/uL (ref 4.22–5.81)
RDW: 12.7 % (ref 11.5–15.5)
WBC: 13.3 10*3/uL — ABNORMAL HIGH (ref 4.0–10.5)
nRBC: 0 % (ref 0.0–0.2)

## 2021-05-28 LAB — COMPREHENSIVE METABOLIC PANEL
ALT: 38 U/L (ref 0–44)
AST: 29 U/L (ref 15–41)
Albumin: 3.8 g/dL (ref 3.5–5.0)
Alkaline Phosphatase: 70 U/L (ref 38–126)
Anion gap: 8 (ref 5–15)
BUN: 18 mg/dL (ref 8–23)
CO2: 24 mmol/L (ref 22–32)
Calcium: 8.6 mg/dL — ABNORMAL LOW (ref 8.9–10.3)
Chloride: 104 mmol/L (ref 98–111)
Creatinine, Ser: 0.87 mg/dL (ref 0.61–1.24)
GFR, Estimated: >60 mL/min (ref >60)
Glucose, Bld: 169 mg/dL — ABNORMAL HIGH (ref 70–99)
Potassium: 3.7 mmol/L (ref 3.5–5.1)
Sodium: 136 mmol/L (ref 135–145)
Total Bilirubin: 2 mg/dL — ABNORMAL HIGH (ref 0.3–1.2)
Total Protein: 7.2 g/dL (ref 6.5–8.1)

## 2021-05-28 LAB — TROPONIN I (HIGH SENSITIVITY)
Troponin I (High Sensitivity): 82 ng/L — ABNORMAL HIGH (ref <18)
Troponin I (High Sensitivity): 77 ng/L — ABNORMAL HIGH (ref <18)
Troponin I (High Sensitivity): 64 ng/L — ABNORMAL HIGH (ref <18)
Troponin I (High Sensitivity): 59 ng/L — ABNORMAL HIGH (ref <18)

## 2021-05-28 LAB — BRAIN NATRIURETIC PEPTIDE: B Natriuretic Peptide: 232.6 pg/mL — ABNORMAL HIGH (ref 0.0–100.0)

## 2021-05-28 LAB — RESP PANEL BY RT-PCR (FLU A&B, COVID) ARPGX2
Influenza A by PCR: NEGATIVE
Influenza B by PCR: NEGATIVE
SARS Coronavirus 2 by RT PCR: NEGATIVE

## 2021-05-28 LAB — HIV ANTIBODY (ROUTINE TESTING W REFLEX): HIV Screen 4th Generation wRfx: NONREACTIVE

## 2021-05-28 LAB — PROTIME-INR
INR: 1.2 (ref 0.8–1.2)
Prothrombin Time: 14.7 seconds (ref 11.4–15.2)

## 2021-05-28 MED ORDER — ACETAMINOPHEN 650 MG RE SUPP: 650.0000 mg | Freq: Four times a day (QID) | RECTAL | Status: DC | PRN

## 2021-05-28 MED ORDER — PERFLUTREN LIPID MICROSPHERE
1.0000 mL | INTRAVENOUS | Status: AC | PRN
  Administered 2021-05-28: 2 mL via INTRAVENOUS
  Filled 2021-05-28: qty 10

## 2021-05-28 MED ORDER — SODIUM CHLORIDE 0.9% FLUSH
3.0000 mL | Freq: Two times a day (BID) | INTRAVENOUS | Status: DC
  Administered 2021-05-28 – 2021-05-30 (×4): 3 mL via INTRAVENOUS

## 2021-05-28 MED ORDER — FUROSEMIDE 10 MG/ML IJ SOLN
40.0000 mg | Freq: Once | INTRAMUSCULAR | Status: AC
  Administered 2021-05-28: 40 mg via INTRAVENOUS
  Filled 2021-05-28: qty 4

## 2021-05-28 MED ORDER — GUAIFENESIN-DM 100-10 MG/5ML PO SYRP
5.0000 mL | ORAL_SOLUTION | ORAL | Status: DC | PRN
  Administered 2021-05-28 – 2021-05-29 (×2): 5 mL via ORAL
  Filled 2021-05-28 (×2): qty 5

## 2021-05-28 MED ORDER — ACETAMINOPHEN 325 MG PO TABS
650.0000 mg | ORAL_TABLET | Freq: Four times a day (QID) | ORAL | Status: DC | PRN
  Administered 2021-05-28: 650 mg via ORAL
  Filled 2021-05-28: qty 2

## 2021-05-28 MED ORDER — LACTATED RINGERS IV BOLUS (SEPSIS)
1000.0000 mL | Freq: Once | INTRAVENOUS | Status: DC
  Administered 2021-05-28: 1000 mL via INTRAVENOUS

## 2021-05-28 MED ORDER — FUROSEMIDE 10 MG/ML IJ SOLN: 40.0000 mg | Freq: Every day | INTRAMUSCULAR | Status: DC

## 2021-05-28 MED ORDER — SODIUM CHLORIDE 0.9 % IV SOLN: 2.0000 g | INTRAVENOUS | Status: DC

## 2021-05-28 MED ORDER — NITROGLYCERIN 2 % TD OINT
1.0000 [in_us] | TOPICAL_OINTMENT | Freq: Once | TRANSDERMAL | Status: AC
  Administered 2021-05-28: 1 [in_us] via TOPICAL
  Filled 2021-05-28: qty 1

## 2021-05-28 MED ORDER — METOPROLOL SUCCINATE ER 25 MG PO TB24
25.0000 mg | ORAL_TABLET | Freq: Every day | ORAL | Status: DC
  Administered 2021-05-28 – 2021-05-30 (×3): 25 mg via ORAL
  Filled 2021-05-28 (×3): qty 1

## 2021-05-28 MED ORDER — HYDRALAZINE HCL 20 MG/ML IJ SOLN: 10.0000 mg | Freq: Four times a day (QID) | INTRAMUSCULAR | Status: DC | PRN

## 2021-05-28 MED ORDER — FUROSEMIDE 10 MG/ML IJ SOLN
40.0000 mg | Freq: Every day | INTRAMUSCULAR | Status: DC
  Administered 2021-05-29 – 2021-05-30 (×2): 40 mg via INTRAVENOUS
  Filled 2021-05-28 (×2): qty 4

## 2021-05-28 MED ORDER — ONDANSETRON HCL 4 MG PO TABS: 4.0000 mg | ORAL_TABLET | Freq: Four times a day (QID) | ORAL | Status: DC | PRN

## 2021-05-28 MED ORDER — AZITHROMYCIN 500 MG IV SOLR: 500.0000 mg | INTRAVENOUS | Status: DC

## 2021-05-28 MED ORDER — NITROGLYCERIN 0.4 MG SL SUBL: 0.4000 mg | SUBLINGUAL_TABLET | SUBLINGUAL | Status: DC | PRN

## 2021-05-28 MED ORDER — ONDANSETRON HCL 4 MG/2ML IJ SOLN: 4.0000 mg | Freq: Four times a day (QID) | INTRAMUSCULAR | Status: DC | PRN

## 2021-05-28 MED ORDER — ENOXAPARIN SODIUM 60 MG/0.6ML IJ SOSY
0.5000 mg/kg | PREFILLED_SYRINGE | INTRAMUSCULAR | Status: DC
  Administered 2021-05-28 – 2021-05-29 (×2): 60 mg via SUBCUTANEOUS
  Filled 2021-05-28 (×3): qty 0.6

## 2021-05-28 MED ORDER — SODIUM CHLORIDE 0.9 % IV SOLN
500.0000 mg | INTRAVENOUS | Status: DC
  Administered 2021-05-28 – 2021-05-30 (×3): 500 mg via INTRAVENOUS
  Filled 2021-05-28 (×3): qty 500

## 2021-05-28 MED ORDER — AMLODIPINE BESYLATE 10 MG PO TABS
10.0000 mg | ORAL_TABLET | Freq: Every day | ORAL | Status: DC
  Administered 2021-05-28 – 2021-05-30 (×3): 10 mg via ORAL
  Filled 2021-05-28 (×2): qty 1
  Filled 2021-05-28: qty 2

## 2021-05-28 MED ORDER — SODIUM CHLORIDE 0.9% FLUSH: 3.0000 mL | INTRAVENOUS | Status: DC | PRN

## 2021-05-28 MED ORDER — IOHEXOL 350 MG/ML SOLN
75.0000 mL | Freq: Once | INTRAVENOUS | Status: AC | PRN
  Administered 2021-05-28: 75 mL via INTRAVENOUS

## 2021-05-28 MED ORDER — SODIUM CHLORIDE 0.9 % IV SOLN
250.0000 mL | INTRAVENOUS | Status: DC | PRN
  Administered 2021-05-29: 30 mL via INTRAVENOUS

## 2021-05-28 MED ORDER — SODIUM CHLORIDE 0.9 % IV SOLN
2.0000 g | INTRAVENOUS | Status: DC
  Administered 2021-05-28 – 2021-05-30 (×3): 2 g via INTRAVENOUS
  Filled 2021-05-28: qty 20
  Filled 2021-05-28: qty 2
  Filled 2021-05-28: qty 20

## 2021-05-28 MED ORDER — SODIUM CHLORIDE 0.9 % IV BOLUS
500.0000 mL | Freq: Once | INTRAVENOUS | Status: AC
  Administered 2021-05-28: 500 mL via INTRAVENOUS

## 2021-05-28 MED ORDER — ENOXAPARIN SODIUM 40 MG/0.4ML IJ SOSY: 40.0000 mg | PREFILLED_SYRINGE | INTRAMUSCULAR | Status: DC

## 2021-05-28 NOTE — Plan of Care (Signed)
  Problem: Activity: Goal: Ability to tolerate increased activity will improve Outcome: Progressing   Problem: Clinical Measurements: Goal: Ability to maintain a body temperature in the normal range will improve Outcome: Progressing   Problem: Respiratory: Goal: Ability to maintain adequate ventilation will improve Outcome: Progressing Goal: Ability to maintain a clear airway will improve Outcome: Progressing   

## 2021-05-28 NOTE — ED Notes (Signed)
Patient transported to CT 

## 2021-05-28 NOTE — Progress Notes (Signed)
CODE SEPSIS - PHARMACY COMMUNICATION  **Broad Spectrum Antibiotics should be administered within 1 hour of Sepsis diagnosis**  Time Code Sepsis Called/Page Received: 1937  Antibiotics Ordered: ceftriaxone, azithromycin  Time of 1st antibiotic administration: 0758  Additional action taken by pharmacy: N/A   Tressie Ellis 05/28/2021  7:46 AM

## 2021-05-28 NOTE — H&P (Signed)
History and Physical    Philip SellaRonald E Roehm ZOX:[id number] DOB: 12/31/1960 DOA: 05/28/2021  PCP: Patient, No Pcp Per (Inactive)   Patient coming from: Home  I have personally briefly [facility]  Chief Complaint: Shortness of breath  HPI: Philip Romero is a 60 y.o. male with no significant past medical history presents to the emergency room for evaluation of a 4-day history of shortness of breath mostly with exertion associated with cough productive of gray phlegm and wheezing.  He also complains of generalized weakness and fatigue.  Over the last 24 hours his shortness of breath got worse and now patient is short of breath at rest associated with orthopnea.  He has been unable to lay flat and slept sitting up in a chair.  He also complains of feeling dizzy and lightheaded He denies having any fever or chills, he has no congestion, no sore throat, no headache, no sick contacts. He is vaccinated and boosted against the COVID-19 virus and took a home COVID test 4 days ago which was negative. He has been taking over-the-counter medications which include DayQuil, NyQuil and Mucinex without any improvement in his symptoms. He denies having any chest pain, no lower extremity swelling, no nausea, no vomiting, no diaphoresis, no palpitations, no abdominal pain, no urinary symptoms, no changes in his bowel habits. Labs show sodium 136, potassium 3.7, chloride 104, bicarb 24, glucose 169, BUN 89, creatinine 0.87, calcium 8.6, alkaline phosphatase 70, albumin 3.8, AST 29, ALT 2021 7.2, BNP 232, troponin 82 >>77, Lactic 2.2 >> 1.2, white count 13.3, hemoglobin 14.1, hematocrit 40.5, MCV 90.4, RDW 12.7, platelet count 240, PT 14.7, INR 1.2 Respiratory viral panel is negative Chest x-ray reviewed by me shows coarse reticular opacities and airways thickening, could reflect atypical infection or developing edema in the appropriate clinical setting. Trace right  effusion. Aortic Atherosclerosis  CT angiogram of the chest shows no definite evidence of pulmonary embolism, though assessment of lower lobe pulmonary arteries is suboptimal due to respiratory motion artifacts. Patchy BILATERAL pulmonary infiltrates favoring multifocal pneumonia. Small BILATERAL pleural effusions larger on RIGHT. Aneurysmal dilatation ascending thoracic aorta 4.1 cm diameter Twelve-lead EKG reviewed by me shows sinus tachycardia with ST and T wave changes involving the lateral leads    ED Course: Patient is a 60 year old male with no significant past medical history who presents to the ER for 4-day history of worsening shortness of breath associated with a cough productive of gray phlegm, orthopnea and wheezing. Imaging shows multifocal pneumonia as well as bilateral pleural effusions.   Patient noted to have significantly elevated blood pressure upon arrival to the ER, with systolic blood pressure in the 190s. He received IV Rocephin and Zithromax as well as IV Lasix and Nitropaste and will be admitted to the hospital for further evaluation.   Review of Systems: As per HPI otherwise all other systems reviewed and negative.    Past Medical History:  Diagnosis Date   Kidney stone     Past Surgical History:  Procedure Laterality Date   EXTRACORPOREAL SHOCK WAVE LITHOTRIPSY Right 03/01/2020   Procedure: EXTRACORPOREAL SHOCK WAVE LITHOTRIPSY (ESWL);  Surgeon: Vanna ScotlandBrandon, Ashley, MD;  Location: [facility] ORS;  Service: Urology;  Laterality: Right;   LITHOTRIPSY     LUMBAR DISC SURGERY       reports that he has never smoked. His smokeless tobacco use includes chew. He reports previous alcohol use. He reports previous drug use.  No Known Allergies  Family History  Problem Relation Age of Onset   Diabetes Mellitus II Mother    Dementia Father       Prior to Admission medications   Medication Sig Start Date End Date Taking? Authorizing Provider  ondansetron (ZOFRAN-ODT) 4 MG  disintegrating tablet Take 1 tablet (4 mg total) by mouth every 8 (eight) hours as needed for nausea or vomiting. Patient not taking: Reported on 05/28/2021 02/29/20   Cuthriell, Delorise Royals, PA-C  oxyCODONE-acetaminophen (PERCOCET/ROXICET) 5-325 MG tablet Take 1 tablet by mouth every 6 (six) hours as needed for severe pain. Patient not taking: Reported on 05/28/2021 02/29/20   Cuthriell, Delorise Royals, PA-C  tamsulosin (FLOMAX) 0.4 MG CAPS capsule Take 1 capsule (0.4 mg total) by mouth daily. Patient not taking: Reported on 05/28/2021 02/29/20   Racheal Patches, PA-C    Physical Exam: Vitals:   05/28/21 0900 05/28/21 0930 05/28/21 0939 05/28/21 1000  BP: (!) 195/109 (!) 158/98 (!) 158/98 (!) 148/98  Pulse: (!) 107 (!) 120  (!) 108  Resp: (!) 23   (!) 28  Temp:      TempSrc:      SpO2: 98% 94%  96%  Weight:      Height:         Vitals:   05/28/21 0900 05/28/21 0930 05/28/21 0939 05/28/21 1000  BP: (!) 195/109 (!) 158/98 (!) 158/98 (!) 148/98  Pulse: (!) 107 (!) 120  (!) 108  Resp: (!) 23   (!) 28  Temp:      TempSrc:      SpO2: 98% 94%  96%  Weight:      Height:          Constitutional: Alert and oriented x 3 .  Appears to be in moderate respiratory distress  HEENT:      Head: Normocephalic and atraumatic.         Eyes: PERLA, EOMI, Conjunctivae are normal. Sclera is non-icteric.       Mouth/Throat: Mucous membranes are moist.       Neck: Supple with no signs of meningismus. Cardiovascular: Tachycardic. No murmurs, gallops, or rubs. 2+ symmetrical distal pulses are present . No JVD. No LE edema Respiratory: Tachypneic. Coarse breath sounds in all lung fields. Faint wheezes Gastrointestinal: Soft, non tender, and non distended with positive bowel sounds.  Genitourinary: No CVA tenderness. Musculoskeletal: Nontender with normal range of motion in all extremities. No cyanosis, or erythema of extremities. Neurologic:  Face is symmetric. Moving all extremities. No gross focal  neurologic deficits . Skin: Skin is warm, dry.  No rash or ulcers Psychiatric: Mood and affect are normal    Labs on Admission: I have personally reviewed following labs and imaging studies  CBC: Recent Labs  Lab 05/28/21 0350  WBC 13.3*  NEUTROABS 11.3*  HGB 14.1  HCT 40.5  MCV 90.4  PLT 240   Basic Metabolic Panel: Recent Labs  Lab 05/28/21 0350  NA 136  K 3.7  CL 104  CO2 24  GLUCOSE 169*  BUN 18  CREATININE 0.87  CALCIUM 8.6*   GFR: Estimated Creatinine Clearance: 122.8 mL/min (by C-G formula based on SCr of 0.87 mg/dL). Liver Function Tests: Recent Labs  Lab 05/28/21 0350  AST 29  ALT 38  ALKPHOS 70  BILITOT 2.0*  PROT 7.2  ALBUMIN 3.8   No results for input(s): LIPASE, AMYLASE in the last 168 hours. No results for input(s): AMMONIA in the last 168 hours. Coagulation Profile: Recent Labs  Lab  05/28/21 0604  INR 1.2   Cardiac Enzymes: No results for input(s): CKTOTAL, CKMB, CKMBINDEX, TROPONINI in the last 168 hours. BNP (last 3 results) No results for input(s): PROBNP in the last 8760 hours. HbA1C: No results for input(s): HGBA1C in the last 72 hours. CBG: No results for input(s): GLUCAP in the last 168 hours. Lipid Profile: No results for input(s): CHOL, HDL, LDLCALC, TRIG, CHOLHDL, LDLDIRECT in the last 72 hours. Thyroid Function Tests: No results for input(s): TSH, T4TOTAL, FREET4, T3FREE, THYROIDAB in the last 72 hours. Anemia Panel: No results for input(s): VITAMINB12, FOLATE, FERRITIN, TIBC, IRON, RETICCTPCT in the last 72 hours. Urine analysis:    Component Value Date/Time   COLORURINE YELLOW (A) 05/28/2021 0756   APPEARANCEUR CLEAR (A) 05/28/2021 0756   APPEARANCEUR Cloudy (A) 03/15/2020 0938   LABSPEC 1.040 (H) 05/28/2021 0756   PHURINE 5.0 05/28/2021 0756   GLUCOSEU NEGATIVE 05/28/2021 0756   HGBUR MODERATE (A) 05/28/2021 0756   BILIRUBINUR NEGATIVE 05/28/2021 0756   BILIRUBINUR Negative 03/15/2020 0938   KETONESUR NEGATIVE  05/28/2021 0756   PROTEINUR NEGATIVE 05/28/2021 0756   NITRITE NEGATIVE 05/28/2021 0756   LEUKOCYTESUR TRACE (A) 05/28/2021 0756    Radiological Exams on Admission: DG Chest 2 View  Result Date: 05/28/2021 CLINICAL DATA:  Shortness of breath EXAM: CHEST - 2 VIEW COMPARISON:  None. FINDINGS: Some coarse reticular opacities and airways thickening are present. Suspect small right pleural effusion. No left effusion or pneumothorax. The aorta is calcified. The remaining cardiomediastinal contours are unremarkable. No acute osseous or soft tissue abnormality. Degenerative changes are present in the imaged spine and shoulders. IMPRESSION: Coarse reticular opacities and airways thickening, could reflect atypical infection or developing edema in the appropriate clinical setting. Trace right effusion. Aortic Atherosclerosis (ICD10-I70.0). Electronically Signed   By: Kreg Shropshire M.D.   On: 05/28/2021 04:16   CT Angio Chest PE W and/or Wo Contrast  Result Date: 05/28/2021 CLINICAL DATA:  Productive cough, weakness, shortness of breath, wheezing, oxygen desaturation, question pulmonary embolism EXAM: CT ANGIOGRAPHY CHEST WITH CONTRAST TECHNIQUE: Multidetector CT imaging of the chest was performed using the standard protocol during bolus administration of intravenous contrast. Multiplanar CT image reconstructions and MIPs were obtained to evaluate the vascular anatomy. CONTRAST:  59mL OMNIPAQUE IOHEXOL 350 MG/ML SOLN IV COMPARISON:  None FINDINGS: Cardiovascular: Atherosclerotic calcifications aorta, coronary arteries, and proximal great vessels. Aneurysmal dilatation ascending thoracic aorta 4.1 cm transverse image 50. Upper normal heart size. No significant pericardial effusion. Pulmonary arteries adequately opacified. Scattered respiratory motion artifacts in lower lobes. No definite pulmonary emboli identified. Mediastinum/Nodes: Esophagus unremarkable. Few scattered normal size mediastinal lymph nodes without  thoracic adenopathy. Base of cervical region normal appearance. Lungs/Pleura: Small BILATERAL pleural effusions greater on RIGHT. Consolidation and dependent atelectasis in lower lobes. Additional patchy infiltrate in lingula in and RIGHT middle lobe, less in the upper lobes bilaterally. Central peribronchial thickening. No pneumothorax. Upper Abdomen: Unremarkable Musculoskeletal: Unremarkable Review of the MIP images confirms the above findings. IMPRESSION: No definite evidence of pulmonary embolism, though assessment of lower lobe pulmonary arteries is suboptimal due to respiratory motion artifacts. Patchy BILATERAL pulmonary infiltrates favoring multifocal pneumonia. Small BILATERAL pleural effusions larger on RIGHT. Aneurysmal dilatation ascending thoracic aorta 4.1 cm diameter, recommendation below. Recommend annual imaging followup by CTA or MRA. This recommendation follows 2010 ACCF/AHA/AATS/ACR/ASA/SCA/SCAI/SIR/STS/SVM Guidelines for the Diagnosis and Management of Patients with Thoracic Aortic Disease. Circulation. 2010; 121: W098-J191. Aortic aneurysm NOS (ICD10-I71.9) Aortic Atherosclerosis (ICD10-I70.0) and Aortic aneurysm NOS (ICD10-I71.9). Electronically Signed  By: Ulyses Southward M.D.   On: 05/28/2021 08:28     Assessment/Plan Principal Problem:   Acute respiratory failure (HCC) Active Problems:   CAP (community acquired pneumonia)   Acute CHF (congestive heart failure) (HCC)   Hypertensive urgency   Elevated troponin     Acute respiratory failure Secondary to acute CHF and community-acquired pneumonia. Patient was tachypneic and tachycardic with pulse oximetry of 90% and requires 2 L of oxygen with improvement in pulse oximetry to 95% Will attempt to wean patient off oxygen as tolerated     Community-acquired pneumonia Patient presents for evaluation of shortness of breath initially with exertion but now at rest associated with a cough productive of gray phlegm and wheezing He  has leukocytosis of 13,000 but is afebrile We will treat patient empirically with Rocephin and Zithromax    Acute congestive heart failure Most likely secondary to hypertensive heart disease We will place patient on Lasix 40 mg IV daily Optimize blood pressure control Will start patient on amlodipine 10 mg daily and Toprol-XL 25 mg daily Obtain 2D echocardiogram to assess LVEF    Elevated troponin Most likely secondary to demand ischemia from hypertension We will cycle cardiac enzymes Obtain 2D echocardiogram to rule out regional wall motion abnormality    Hypertensive urgency Optimize blood pressure control Patient has been started on amlodipine and Toprol and will uptitrate antihypertensives as needed to optimize blood pressure control    DVT prophylaxis: Lovenox  Code Status: full code. Family Communication: Greater than 50% of time was spent discussing patient's condition and plan of care with him at the bedside.  All questions and concerns have been addressed.  He verbalizes understanding and agrees with the plan. Disposition Plan: Back to previous home environment Consults called: none  Status: At the time of admission, it appears that the appropriate admission status for this patient is inpatient. This is judged to be reasonable and necessary in order to provide the required intensity of service to ensure the patient's safety given the presenting symptoms, physical exam findings, and initial radiographic and laboratory data in the context of their comorbid conditions. Patient requires inpatient status due to high intensity of service, high risk for further deterioration and high frequency of surveillance required.    Zilpha Mcandrew MD [facility]     05/28/2021, 11:00 AM

## 2021-05-28 NOTE — Progress Notes (Signed)
PHARMACIST - PHYSICIAN COMMUNICATION  CONCERNING:  Enoxaparin (Lovenox) for DVT Prophylaxis    RECOMMENDATION: Patient was prescribed enoxaprin 40mg  q24 hours for VTE prophylaxis.   Filed Weights   05/28/21 0337 05/28/21 0346  Weight: 120.2 kg (265 lb) 120.2 kg (265 lb)    Body mass index is 34.02 kg/m.  Estimated Creatinine Clearance: 122.8 mL/min (by C-G formula based on SCr of 0.87 mg/dL).   Based on [facility] policy patient is candidate for enoxaparin 0.5mg /kg TBW SQ every 24 hours based on BMI being >30.  DESCRIPTION: Pharmacy has adjusted enoxaparin dose per [facility] policy.  Patient is now receiving enoxaparin 60 mg every 24 hours    [facility], PharmD Clinical Pharmacist  05/28/2021 10:59 AM

## 2021-05-28 NOTE — ED Provider Notes (Addendum)
[facility] Emergency Department Provider Note  ____________________________________________   Event Date/Time   First MD Initiated Contact with Patient 05/28/21 0533     (approximate)  I have reviewed the triage vital signs and the nursing notes.   HISTORY  Chief Complaint Shortness of Breath    HPI Philip Romero is a 60 y.o. male who presents for evaluation of about 4 days of generalized weakness and fatigue and acute onset of shortness of breath, cough, and wheezing over the last 24 hours.  He said that the symptoms started acutely and were mild to moderate but have become more severe over the last 24 hours.  He has had some nausea but no vomiting.  He denies any chest pain and he denies abdominal pain.  He has not had much appetite.  He has not had a fever, no sore throat, and no runny nose or nasal congestion.  He took a home COVID test 4 days ago which was negative.  However his shortness of breath and dry cough and wheezing have developed over the last 24 hours.  Exertion makes his symptoms worse and rest makes the shortness of breath a little bit better.  He was placed on 2 L of oxygen in triage because he was in increased work of breathing, wheezing, and about 90% SPO2 on room air oxygen made him feel better as well.  He has been vaccinated and boosted for COVID-19.  He does not smoke and has no history of heart disease of which he is aware of.     Past Medical History:  Diagnosis Date   Kidney stone     There are no problems to display for this patient.   Past Surgical History:  Procedure Laterality Date   EXTRACORPOREAL SHOCK WAVE LITHOTRIPSY Right 03/01/2020   Procedure: EXTRACORPOREAL SHOCK WAVE LITHOTRIPSY (ESWL);  Surgeon: [name], MD;  Location: [facility] ORS;  Service: Urology;  Laterality: Right;   LITHOTRIPSY     LUMBAR DISC SURGERY      Prior to Admission medications   Medication Sig Start Date End Date Taking? Authorizing  Provider  ondansetron (ZOFRAN-ODT) 4 MG disintegrating tablet Take 1 tablet (4 mg total) by mouth every 8 (eight) hours as needed for nausea or vomiting. 02/29/20   Cuthriell, Delorise Royals, PA-C  oxyCODONE-acetaminophen (PERCOCET/ROXICET) 5-325 MG tablet Take 1 tablet by mouth every 6 (six) hours as needed for severe pain. 02/29/20   Cuthriell, Delorise Royals, PA-C  tamsulosin (FLOMAX) 0.4 MG CAPS capsule Take 1 capsule (0.4 mg total) by mouth daily. 02/29/20   Cuthriell, Delorise Royals, PA-C    Allergies Patient has no known allergies.  No family history on file.  Social History Social History   Tobacco Use   Smoking status: Never   Smokeless tobacco: Current    Types: Chew  Vaping Use   Vaping Use: Never used  Substance Use Topics   Alcohol use: Not Currently   Drug use: Not Currently    Review of Systems Constitutional: Positive for generalized weakness and fatigue.  No fever/chills Eyes: No visual changes. ENT: No sore throat. Cardiovascular: Denies chest pain. Respiratory: Positive for shortness of breath and cough. Gastrointestinal: No abdominal pain.  No nausea, no vomiting.  No diarrhea.  No constipation. Genitourinary: Negative for dysuria. Musculoskeletal: Negative for neck pain.  Negative for back pain. Integumentary: Negative for rash. Neurological: Negative for headaches, focal weakness or numbness.   ____________________________________________   PHYSICAL EXAM:  VITAL SIGNS: ED Triage  Vitals  Enc Vitals Group     BP 05/28/21 0338 (!) 145/81     Pulse Rate 05/28/21 0338 (!) 111     Resp 05/28/21 0338 (!) 22     Temp 05/28/21 0338 99.8 F (37.7 C)     Temp Source 05/28/21 0338 Oral     SpO2 05/28/21 0338 90 %     Weight 05/28/21 0337 120.2 kg (265 lb)     Height 05/28/21 0337 1.88 m (6\' 2" )     Head Circumference --      Peak Flow --      Pain Score 05/28/21 0346 0     Pain Loc --      Pain Edu? --      Excl. in GC? --     Constitutional: Alert and  oriented.  Eyes: Conjunctivae are normal.  Head: Atraumatic. Nose: No congestion/rhinnorhea. Mouth/Throat: Patient is wearing a mask. Neck: No stridor.  No meningeal signs.   Cardiovascular: Normal rate, regular rhythm. Good peripheral circulation. Respiratory: Lungs are clear to auscultation bilaterally.  On 2 L of oxygen the patient is comfortable.  However when put on room air, with any amount of exertion he becomes tachypneic with intercostal retractions and audible wheezing. Gastrointestinal: Soft and nontender. No distention.  Musculoskeletal: No lower extremity tenderness nor edema. No gross deformities of extremities. Neurologic:  Normal speech and language. No gross focal neurologic deficits are appreciated.  Skin:  Skin is warm, dry and intact. Psychiatric: Mood and affect are normal. Speech and behavior are normal.  ____________________________________________   LABS (all labs ordered are listed, but only abnormal results are displayed)  Labs Reviewed  CBC WITH DIFFERENTIAL/PLATELET - Abnormal; Notable for the following components:      Result Value   WBC 13.3 (*)    Neutro Abs 11.3 (*)    All other components within normal limits  COMPREHENSIVE METABOLIC PANEL - Abnormal; Notable for the following components:   Glucose, Bld 169 (*)    Calcium 8.6 (*)    Total Bilirubin 2.0 (*)    All other components within normal limits  LACTIC ACID, PLASMA - Abnormal; Notable for the following components:   Lactic Acid, Venous 2.2 (*)    All other components within normal limits  BRAIN NATRIURETIC PEPTIDE - Abnormal; Notable for the following components:   B Natriuretic Peptide 232.6 (*)    All other components within normal limits  TROPONIN I (HIGH SENSITIVITY) - Abnormal; Notable for the following components:   Troponin I (High Sensitivity) 82 (*)    All other components within normal limits  TROPONIN I (HIGH SENSITIVITY) - Abnormal; Notable for the following components:    Troponin I (High Sensitivity) 77 (*)    All other components within normal limits  RESP PANEL BY RT-PCR (FLU A&B, COVID) ARPGX2  CULTURE, BLOOD (SINGLE)  URINE CULTURE  CULTURE, BLOOD (SINGLE)  PROTIME-INR  LACTIC ACID, PLASMA  URINALYSIS, COMPLETE (UACMP) WITH MICROSCOPIC   ____________________________________________  EKG  ED ECG REPORT I, 05/30/21, the attending physician, personally viewed and interpreted this ECG.  Date: 05/28/2021 EKG Time: 3:41 AM Rate: 110 Rhythm: Sinus tachycardia QRS Axis: Left axis deviation Intervals: normal ST/T Wave abnormalities: Non-specific ST segment / T-wave changes, but no clear evidence of acute ischemia. Narrative Interpretation: no definitive evidence of acute ischemia; does not meet STEMI criteria.  ____________________________________________  RADIOLOGY I, 05/30/2021, personally viewed and evaluated these images (plain radiographs) as part of my medical decision making, as  well as reviewing the written report by the radiologist.  ED MD interpretation: Atypical infectious pattern versus developing edema  Official radiology report(s): DG Chest 2 View  Result Date: 05/28/2021 CLINICAL DATA:  Shortness of breath EXAM: CHEST - 2 VIEW COMPARISON:  None. FINDINGS: Some coarse reticular opacities and airways thickening are present. Suspect small right pleural effusion. No left effusion or pneumothorax. The aorta is calcified. The remaining cardiomediastinal contours are unremarkable. No acute osseous or soft tissue abnormality. Degenerative changes are present in the imaged spine and shoulders. IMPRESSION: Coarse reticular opacities and airways thickening, could reflect atypical infection or developing edema in the appropriate clinical setting. Trace right effusion. Aortic Atherosclerosis (ICD10-I70.0). Electronically Signed   By: Kreg Shropshire M.D.   On: 05/28/2021 04:16     ____________________________________________   PROCEDURES   Procedure(s) performed (including Critical Care):  .1-3 Lead EKG Interpretation  Date/Time: 05/28/2021 6:36 AM Performed by: Loleta Rose, MD Authorized by: Loleta Rose, MD     Interpretation: abnormal     ECG rate:  110   ECG rate assessment: tachycardic     Rhythm: sinus tachycardia     Ectopy: none     Conduction: normal     ____________________________________________   INITIAL IMPRESSION / MDM / ASSESSMENT AND PLAN / ED COURSE  As part of my medical decision making, I reviewed the following data within the electronic MEDICAL RECORD NUMBER Nursing notes reviewed and incorporated, Labs reviewed , EKG interpreted , Old chart reviewed, Radiograph reviewed , and Notes from prior ED visits   Differential diagnosis includes, but is not limited to, COVID-19, community-acquired pneumonia, nonspecific viral illness, less likely new onset CHF or ACS.  PE is also possible but he has no risk factors.  The patient is on the cardiac monitor to evaluate for evidence of arrhythmia and/or significant heart rate changes.  I personally reviewed the patient's imaging and agree with the radiologist's interpretation that the findings are suggestive of an atypical infection which would support the possibility of COVID-19 in spite of his vaccination status.  His temperature is just about 100 degrees oral and he has mildly tachycardic.  There is no lobar pneumonia on the chest x-ray and I will hold off on empiric antibiotics.  He is generally well-appearing but needs supplemental oxygen in order to avoid being quite short of breath and he was 90% at rest on room air.  He has a nonischemic EKG and a high-sensitivity troponin of 82 which I suspect is demand related rather than representative of NSTEMI.  CBC is notable for leukocytosis of 13.3 and he has an essentially normal comprehensive metabolic panel.  BNP is about 233 which is of unclear  clinical significance.  Lactic acid is pending, respiratory viral panel is pending.  I made him "possible sepsis" but I think it is very likely this is COVID-19.  He understands and agrees with the current evaluation.  I ordered a 500 mL normal saline bolus in case some of his tachycardia is volume related.  He is not hypotensive and is in fact fairly hypertensive and his presentation is not consistent with septic shock.     Clinical Course as of 05/28/21 9323  Tue May 28, 2021  5573 The patient is sitting on the side of the bed at rest breathing 36 times a minute.  This is on his.  He is currently on 2 L of supplemental oxygen.He is on 2L of supplemental oxygen.  He needs admission to the hospital and  I am treating him for sepsis and community-acquired pneumonia with ceftriaxone 2 g IV and azithromycin 500 mg IV.  I am giving him another liter of IV fluids but he is not exhibiting shock physiology and I do not feel that he needs the full 3.5 L of IV fluids which would be 30 mL/kg.  Additionally he is showing some signs at least of possible new onset CHF and I do not want to volume overload him.  Given his constellation of symptoms, I think it is reasonable to obtain a CTA chest to rule out pulmonary embolism as well as to better evaluate his lung parenchyma.  I have consulted the hospitalist for admission.  The patient understands and agrees with the plan and understands the need to stay in the hospital. [CF]  [phone] Patient dropped to 86% with good waveform. [CF]    Clinical Course User Index [CF] Loleta RoseForbach, Lael Wetherbee, MD     ____________________________________________  FINAL CLINICAL IMPRESSION(S) / ED DIAGNOSES  Final diagnoses:  Sepsis with acute respiratory failure without septic shock, due to unspecified organism, unspecified whether hypoxia or hypercapnia present (HCC)  Community acquired pneumonia, unspecified laterality  Acute respiratory failure with hypoxemia (HCC)     MEDICATIONS  GIVEN DURING THIS VISIT:  Medications  lactated ringers bolus 1,000 mL (has no administration in time range)  cefTRIAXone (ROCEPHIN) 2 g in sodium chloride 0.9 % 100 mL IVPB (has no administration in time range)  azithromycin (ZITHROMAX) 500 mg in sodium chloride 0.9 % 250 mL IVPB (has no administration in time range)  sodium chloride 0.9 % bolus 500 mL (0 mLs Intravenous Stopped 05/28/21 0747)     ED Discharge Orders     None        Note:  This document was prepared using Dragon voice recognition software and may include unintentional dictation errors.   Loleta RoseForbach, Shakai Dolley, MD 05/28/21 [id number]    Loleta RoseForbach, Tycho Cheramie, MD 05/28/21 [id number]    Loleta RoseForbach, Ione Sand[place]ky, MD 05/28/21 [phone]

## 2021-05-28 NOTE — ED Notes (Signed)
SLP at bedside.

## 2021-05-28 NOTE — Sepsis Progress Note (Signed)
Notified bedside nurse of need to draw repeat lactic acid. 

## 2021-05-28 NOTE — Sepsis Progress Note (Signed)
ELINK tracking the Code Sepsis. 

## 2021-05-28 NOTE — ED Triage Notes (Addendum)
Patient ambulatory to triage with steady gait, without difficulty or distress noted; pt reports since Friday having prod cough, weakness and then tonight SHOB; O2 placed at 2l/min via  to bring sat from 90% on ra to 95%

## 2021-05-29 LAB — BLOOD CULTURE ID PANEL (REFLEXED) - BCID2

## 2021-05-29 LAB — CBC
HCT: 39.4 % (ref 39.0–52.0)
Hemoglobin: 13.7 g/dL (ref 13.0–17.0)
MCH: 31.2 pg (ref 26.0–34.0)
MCHC: 34.8 g/dL (ref 30.0–36.0)
MCV: 89.7 fL (ref 80.0–100.0)
Platelets: 228 10*3/uL (ref 150–400)
RBC: 4.39 MIL/uL (ref 4.22–5.81)
RDW: 12.8 % (ref 11.5–15.5)
WBC: 8.2 10*3/uL (ref 4.0–10.5)
nRBC: 0 % (ref 0.0–0.2)

## 2021-05-29 LAB — RESPIRATORY PANEL BY PCR

## 2021-05-29 LAB — ECHOCARDIOGRAM COMPLETE
Area-P 1/2: 2.8 cm2
Height: 74 in
S' Lateral: 3.1 cm
Weight: 4318.4 oz

## 2021-05-29 LAB — URINE CULTURE: Culture: NO GROWTH

## 2021-05-29 LAB — BASIC METABOLIC PANEL
Anion gap: 11 (ref 5–15)
BUN: 19 mg/dL (ref 8–23)
CO2: 25 mmol/L (ref 22–32)
Calcium: 8.7 mg/dL — ABNORMAL LOW (ref 8.9–10.3)
Chloride: 97 mmol/L — ABNORMAL LOW (ref 98–111)
Creatinine, Ser: 0.83 mg/dL (ref 0.61–1.24)
GFR, Estimated: >60 mL/min (ref >60)
Glucose, Bld: 120 mg/dL — ABNORMAL HIGH (ref 70–99)
Potassium: 3.6 mmol/L (ref 3.5–5.1)
Sodium: 133 mmol/L — ABNORMAL LOW (ref 135–145)

## 2021-05-29 LAB — PROCALCITONIN: Procalcitonin: 0.12 ng/mL

## 2021-05-29 NOTE — Plan of Care (Signed)
  Problem: Activity: Goal: Ability to tolerate increased activity will improve Outcome: Progressing   Problem: Respiratory: Goal: Ability to maintain adequate ventilation will improve Outcome: Progressing   Problem: Activity: Goal: Ability to tolerate increased activity will improve Outcome: Progressing   

## 2021-05-29 NOTE — Progress Notes (Signed)
PROGRESS NOTE    Philip Romero  ZOX:[id number] DOB: 07/18/1961 DOA: 05/28/2021 PCP: Patient, No Pcp Per (Inactive)   Chief complaint.  Shortness of breath. Brief Narrative:  Philip Romero is a 60 y.o. male with no significant past medical history presents to the emergency room for evaluation of a 4-day history of shortness of breath mostly with exertion associated with cough productive of gray phlegm and wheezing.  He is negative for COVID.  Chest x-ray showed coarse reticular opacities and airway thickening.  CT angiogram of the chest showed no evidence of PE.  Patchy bilateral pulmonary infiltrates with possible multifocal pneumonia.  Procalcitonin level 0.12, initial BNP 232.  Troponin 82. Patient was placed on antibiotics with Rocephin and Zithromax, also given 40 mg IV Lasix daily.  Patient has been requiring 2 L oxygen.   Assessment & Plan:   Principal Problem:   Acute respiratory failure (HCC) Active Problems:   CAP (community acquired pneumonia)   Acute CHF (congestive heart failure) (HCC)   Hypertensive urgency   Elevated troponin   1. Acute respiratory failure with hypoxemia Bilateral lower lobe multifocal pneumonia.  Favor viral pneumonia. Likely acute diastolic congestive heart failure.  Pending echocardiogram to evaluate ejection fraction. Elevated troponin secondary to demand ischemia from pneumonia and respiratory failure. Hypertension emergency.  Patient feels better today.  Currently on 1 L oxygen.  Bronchospasm is better. Patient has a negative COVID test, will obtain respiratory viral panel. Continue antibiotics with Rocephin and Zithromax. Patient was also has mild elevation of BNP and troponin.  Troponin most likely due to demand ischemia from hypoxemia. Patient is giving IV Lasix, pending echocardiogram.  Continue Lasix for today, recheck a BMP tomorrow.  #2.  Thoracic aortic aneurysm.  Incidental finding on CT scan.  Will follow with PCP for yearly CT  imaging study.    DVT prophylaxis: Lovenoc Code Status: Full Family Communication: Wife at the bedside and updated. Disposition Plan:    Status is: Inpatient  Remains inpatient appropriate because:IV treatments appropriate due to intensity of illness or inability to take PO and Inpatient level of care appropriate due to severity of illness  Dispo: The patient is from: Home              Anticipated d/c is to: Home              Patient currently is not medically stable to d/c.   Difficult to place patient No        I/O last 3 completed shifts: In: 720 [P.O.:120; IV Piggyback:600] Out: 1900 [Urine:1900] Total I/O In: 480 [P.O.:480] Out: -      Consultants:  None  Procedures: None  Antimicrobials: ( Rocephin and Zithromax. Subjective: Patient feels better with short of breath today, states that he is 60% better than yesterday.  Still has some wheezing, still has significant amount of white and yellow mucus. No fever or chills No abdominal pain or nausea vomiting, he had a large bowel movements yesterday. No dysuria hematuria. No chest pain or palpitation.  Objective: Vitals:   05/28/21 2000 05/29/21 0046 05/29/21 0507 05/29/21 0802  BP: 119/75 137/75 125/73 (!) 144/82  Pulse: 88 92 92 92  Resp: 16 16 16 18   Temp: 99.1 F (37.3 C) 98.8 F (37.1 C) 99.5 F (37.5 C) 98.6 F (37 C)  TempSrc:  Oral Oral   SpO2: 95% 96% 91% 91%  Weight:   121.7 kg   Height:        Intake/Output  Summary (Last 24 hours) at 05/29/2021 1136 Last data filed at 05/29/2021 1017 Gross per 24 hour  Intake 600 ml  Output 300 ml  Net 300 ml   Filed Weights   05/28/21 0346 05/28/21 1525 05/29/21 0507  Weight: 120.2 kg 122.4 kg 121.7 kg    Examination:  General exam: Appears calm and comfortable  Respiratory system: Clear to auscultation. Respiratory effort normal. Cardiovascular system: S1 & S2 heard, RRR. No JVD, murmurs, rubs, gallops or clicks. No pedal  edema. Gastrointestinal system: Abdomen is nondistended, soft and nontender. No organomegaly or masses felt. Normal bowel sounds heard. Central nervous system: Alert and oriented. No focal neurological deficits. Extremities: Symmetric 5 x 5 power. Skin: No rashes, lesions or ulcers Psychiatry: Judgement and insight appear normal. Mood & affect appropriate.     Data Reviewed: I have personally reviewed following labs and imaging studies  CBC: Recent Labs  Lab 05/28/21 0350 05/29/21 0728  WBC 13.3* 8.2  NEUTROABS 11.3*  --   HGB 14.1 13.7  HCT 40.5 39.4  MCV 90.4 89.7  PLT 240 228   Basic Metabolic Panel: Recent Labs  Lab 05/28/21 0350 05/29/21 0728  NA 136 133*  K 3.7 3.6  CL 104 97*  CO2 24 25  GLUCOSE 169* 120*  BUN 18 19  CREATININE 0.87 0.83  CALCIUM 8.6* 8.7*   GFR: Estimated Creatinine Clearance: 129.6 mL/min (by C-G formula based on SCr of 0.83 mg/dL). Liver Function Tests: Recent Labs  Lab 05/28/21 0350  AST 29  ALT 38  ALKPHOS 70  BILITOT 2.0*  PROT 7.2  ALBUMIN 3.8   No results for input(s): LIPASE, AMYLASE in the last 168 hours. No results for input(s): AMMONIA in the last 168 hours. Coagulation Profile: Recent Labs  Lab 05/28/21 0604  INR 1.2   Cardiac Enzymes: No results for input(s): CKTOTAL, CKMB, CKMBINDEX, TROPONINI in the last 168 hours. BNP (last 3 results) No results for input(s): PROBNP in the last 8760 hours. HbA1C: No results for input(s): HGBA1C in the last 72 hours. CBG: No results for input(s): GLUCAP in the last 168 hours. Lipid Profile: No results for input(s): CHOL, HDL, LDLCALC, TRIG, CHOLHDL, LDLDIRECT in the last 72 hours. Thyroid Function Tests: No results for input(s): TSH, T4TOTAL, FREET4, T3FREE, THYROIDAB in the last 72 hours. Anemia Panel: No results for input(s): VITAMINB12, FOLATE, FERRITIN, TIBC, IRON, RETICCTPCT in the last 72 hours. Sepsis Labs: Recent Labs  Lab 05/28/21 0603 05/28/21 0756  05/29/21 0728  PROCALCITON  --   --  0.12  LATICACIDVEN 2.2* 1.2  --     Recent Results (from the past 240 hour(s))  Resp Panel by RT-PCR (Flu A&B, Covid) Nasopharyngeal Swab     Status: None   Collection Time: 05/28/21  6:03 AM   Specimen: Nasopharyngeal Swab; Nasopharyngeal(NP) swabs in vial transport medium  Result Value Ref Range Status   SARS Coronavirus 2 by RT PCR NEGATIVE NEGATIVE Final    Comment: (NOTE) SARS-CoV-2 target nucleic acids are NOT DETECTED.  The SARS-CoV-2 RNA is generally detectable in upper respiratory specimens during the acute phase of infection. The lowest concentration of SARS-CoV-2 viral copies this assay can detect is 138 copies/mL. A negative result does not preclude SARS-Cov-2 infection and should not be used as the sole basis for treatment or other patient management decisions. A negative result may occur with  improper specimen collection/handling, submission of specimen other than nasopharyngeal swab, presence of viral mutation(s) within the areas targeted by this assay,  and inadequate number of viral copies(<138 copies/mL). A negative result must be combined with clinical observations, patient history, and epidemiological information. The expected result is Negative.  Fact Sheet for Patients:  BloggerCourse.com  Fact Sheet for Healthcare Providers:  SeriousBroker.it  This test is no t yet approved or cleared by the [place] FDA and  has been authorized for detection and/or diagnosis of SARS-CoV-2 by FDA under an Emergency Use Authorization (EUA). This EUA will remain  in effect (meaning this test can be used) for the duration of the COVID-19 declaration under Section 564(b)(1) of the Act, 21 U.S.C.section 360bbb-3(b)(1), unless the authorization is terminated  or revoked sooner.       Influenza A by PCR NEGATIVE NEGATIVE Final   Influenza B by PCR NEGATIVE NEGATIVE Final    Comment:  (NOTE) The Xpert Xpress SARS-CoV-2/FLU/RSV plus assay is intended as an aid in the diagnosis of influenza from Nasopharyngeal swab specimens and should not be used as a sole basis for treatment. Nasal washings and aspirates are unacceptable for Xpert Xpress SARS-CoV-2/FLU/RSV testing.  Fact Sheet for Patients: BloggerCourse.com  Fact Sheet for Healthcare Providers: SeriousBroker.it  This test is not yet approved or cleared by the [place] FDA and has been authorized for detection and/or diagnosis of SARS-CoV-2 by FDA under an Emergency Use Authorization (EUA). This EUA will remain in effect (meaning this test can be used) for the duration of the COVID-19 declaration under Section 564(b)(1) of the Act, 21 U.S.C. section 360bbb-3(b)(1), unless the authorization is terminated or revoked.  Performed at [facility], [address] [place], [place] [address]   Blood culture (routine single)     Status: None (Preliminary result)   Collection Time: 05/28/21  6:04 AM   Specimen: BLOOD  Result Value Ref Range Status   Specimen Description BLOOD RIGHT [facility]  Final   Special Requests   Final    BOTTLES DRAWN AEROBIC AND ANAEROBIC Blood Culture adequate volume   Culture   Final    NO GROWTH 1 DAY Performed at [facility], [address] [place], [place] [address]    Report Status PENDING  Incomplete  Culture, blood (single)     Status: None (Preliminary result)   Collection Time: 05/28/21  7:56 AM   Specimen: BLOOD  Result Value Ref Range Status   Specimen Description BLOOD RIGHT HAND  Final   Special Requests   Final    BOTTLES DRAWN AEROBIC AND ANAEROBIC Blood Culture adequate volume   Culture  Setup Time   Final    GRAM POSITIVE COCCI AEROBIC BOTTLE ONLY Organism ID to follow CRITICAL RESULT CALLED TO, READ BACK BY AND VERIFIED WITHDawayne Cirri PHARMD [id number] 05/29/21 HNM Performed at [facility],  [address] [place], [place] [address]    Culture GRAM POSITIVE COCCI  Final   Report Status PENDING  Incomplete  Blood Culture ID Panel (Reflexed)     Status: Abnormal   Collection Time: 05/28/21  7:56 AM  Result Value Ref Range Status   Enterococcus faecalis NOT DETECTED NOT DETECTED Final   Enterococcus Faecium NOT DETECTED NOT DETECTED Final   Listeria monocytogenes NOT DETECTED NOT DETECTED Final   Staphylococcus species DETECTED (A) NOT DETECTED Final    Comment: CRITICAL RESULT CALLED TO, READ BACK BY AND VERIFIED WITH: NATHAN BELUE PHARMD [id number] 05/29/21 HNM    Staphylococcus aureus (BCID) NOT DETECTED NOT DETECTED Final   Staphylococcus epidermidis NOT DETECTED NOT DETECTED Final   Staphylococcus  lugdunensis NOT DETECTED NOT DETECTED Final   Streptococcus species NOT DETECTED NOT DETECTED Final   Streptococcus agalactiae NOT DETECTED NOT DETECTED Final   Streptococcus pneumoniae NOT DETECTED NOT DETECTED Final   Streptococcus pyogenes NOT DETECTED NOT DETECTED Final   A.calcoaceticus-baumannii NOT DETECTED NOT DETECTED Final   Bacteroides fragilis NOT DETECTED NOT DETECTED Final   Enterobacterales NOT DETECTED NOT DETECTED Final   Enterobacter cloacae complex NOT DETECTED NOT DETECTED Final   Escherichia coli NOT DETECTED NOT DETECTED Final   Klebsiella aerogenes NOT DETECTED NOT DETECTED Final   Klebsiella oxytoca NOT DETECTED NOT DETECTED Final   Klebsiella pneumoniae NOT DETECTED NOT DETECTED Final   Proteus species NOT DETECTED NOT DETECTED Final   Salmonella species NOT DETECTED NOT DETECTED Final   Serratia marcescens NOT DETECTED NOT DETECTED Final   Haemophilus influenzae NOT DETECTED NOT DETECTED Final   Neisseria meningitidis NOT DETECTED NOT DETECTED Final   Pseudomonas aeruginosa NOT DETECTED NOT DETECTED Final   Stenotrophomonas maltophilia NOT DETECTED NOT DETECTED Final   Candida albicans NOT DETECTED NOT DETECTED Final   Candida auris NOT DETECTED NOT  DETECTED Final   Candida glabrata NOT DETECTED NOT DETECTED Final   Candida krusei NOT DETECTED NOT DETECTED Final   Candida parapsilosis NOT DETECTED NOT DETECTED Final   Candida tropicalis NOT DETECTED NOT DETECTED Final   Cryptococcus neoformans/gattii NOT DETECTED NOT DETECTED Final    Comment: Performed at [facility], [address], [place], [place] [address]         Radiology Studies: DG Chest 2 View  Result Date: 05/28/2021 CLINICAL DATA:  Shortness of breath EXAM: CHEST - 2 VIEW COMPARISON:  None. FINDINGS: Some coarse reticular opacities and airways thickening are present. Suspect small right pleural effusion. No left effusion or pneumothorax. The aorta is calcified. The remaining cardiomediastinal contours are unremarkable. No acute osseous or soft tissue abnormality. Degenerative changes are present in the imaged spine and shoulders. IMPRESSION: Coarse reticular opacities and airways thickening, could reflect atypical infection or developing edema in the appropriate clinical setting. Trace right effusion. Aortic Atherosclerosis (ICD10-I70.0). Electronically Signed   By: Kreg Shropshire M.D.   On: 05/28/2021 04:16   CT Angio Chest PE W and/or Wo Contrast  Result Date: 05/28/2021 CLINICAL DATA:  Productive cough, weakness, shortness of breath, wheezing, oxygen desaturation, question pulmonary embolism EXAM: CT ANGIOGRAPHY CHEST WITH CONTRAST TECHNIQUE: Multidetector CT imaging of the chest was performed using the standard protocol during bolus administration of intravenous contrast. Multiplanar CT image reconstructions and MIPs were obtained to evaluate the vascular anatomy. CONTRAST:  91mL OMNIPAQUE IOHEXOL 350 MG/ML SOLN IV COMPARISON:  None FINDINGS: Cardiovascular: Atherosclerotic calcifications aorta, coronary arteries, and proximal great vessels. Aneurysmal dilatation ascending thoracic aorta 4.1 cm transverse image 50. Upper normal heart size. No significant pericardial  effusion. Pulmonary arteries adequately opacified. Scattered respiratory motion artifacts in lower lobes. No definite pulmonary emboli identified. Mediastinum/Nodes: Esophagus unremarkable. Few scattered normal size mediastinal lymph nodes without thoracic adenopathy. Base of cervical region normal appearance. Lungs/Pleura: Small BILATERAL pleural effusions greater on RIGHT. Consolidation and dependent atelectasis in lower lobes. Additional patchy infiltrate in lingula in and RIGHT middle lobe, less in the upper lobes bilaterally. Central peribronchial thickening. No pneumothorax. Upper Abdomen: Unremarkable Musculoskeletal: Unremarkable Review of the MIP images confirms the above findings. IMPRESSION: No definite evidence of pulmonary embolism, though assessment of lower lobe pulmonary arteries is suboptimal due to respiratory motion artifacts. Patchy BILATERAL pulmonary infiltrates favoring multifocal pneumonia. Small BILATERAL pleural effusions  larger on RIGHT. Aneurysmal dilatation ascending thoracic aorta 4.1 cm diameter, recommendation below. Recommend annual imaging followup by CTA or MRA. This recommendation follows 2010 ACCF/AHA/AATS/ACR/ASA/SCA/SCAI/SIR/STS/SVM Guidelines for the Diagnosis and Management of Patients with Thoracic Aortic Disease. Circulation. 2010; 121: Z610-R604. Aortic aneurysm NOS (ICD10-I71.9) Aortic Atherosclerosis (ICD10-I70.0) and Aortic aneurysm NOS (ICD10-I71.9). Electronically Signed   By: Ulyses Southward M.D.   On: 05/28/2021 08:28        Scheduled Meds:  amLODipine  10 mg Oral Daily   enoxaparin (LOVENOX) injection  0.5 mg/kg Subcutaneous Q24H   furosemide  40 mg Intravenous Daily   metoprolol succinate  25 mg Oral Daily   sodium chloride flush  3 mL Intravenous Q12H   Continuous Infusions:  sodium chloride 30 mL (05/29/21 0903)   azithromycin 500 mg (05/29/21 0904)   cefTRIAXone (ROCEPHIN)  IV 2 g (05/29/21 1034)     LOS: 1 day    Time spent: 32  minutes    Marrion Coy, MD [facility]   To contact the attending provider between 7A-7P or the covering provider during after hours 7P-7A, please log into the web site [email or web address] and access using universal [place] password for that web site. If you do not have the password, please call the hospital operator.  05/29/2021, 11:36 AM

## 2021-05-29 NOTE — Progress Notes (Signed)
PHARMACY - PHYSICIAN COMMUNICATION CRITICAL VALUE ALERT - BLOOD CULTURE IDENTIFICATION (BCID)  Initial BCID results:  1 (aerobic) of 4 with GPC: Staph Species.  Pt currently on Azithromycin 500 mg and Ceftriaxone 2 gm daily.  Name of physician contacted: Dr. Para March  Changes to prescribed antibiotics required: None at this time.  Per MD, "I will wait  to see if others are turning positive first."  Otelia Sergeant, PharmD, [facility] 05/29/2021 4:11 AM

## 2021-05-29 NOTE — Consult Note (Signed)
   Heart Failure Nurse Navigator Note  HFpEF 55-60%.  Mild left ventricular hypertrophy.  Normal right ventricular systolic function.  He presented to the emergency room with complaints of shortness of breath on exertion, productive cough and orthopnea.  On admission to the ED was noted to be hypertensive with blood pressure 192/100.  Comorbidities: No significant past medical history.   Labs:  Sodium 133,, potassium 3.6, chloride 97, CO2 25, BUN 19, creatinine 0.83, BNP 232, procalcitonin 0.12 Weight 121.7 kg Intake 720 mL Output 1900 mL  Initial meeting with patient today.  He states that he feels 300% better, is currently on room air.  He states at home that he was unable to lie down to sleep due to the fact that made the coughing worse.  Discussed his echocardiogram results.  Instructed that he does have normal ejection fraction of 55 to 60% an echo did reveal some LVH.  Discussed lifestyle changes, diet, he states that he does not use salt.  Restricting sodium intake to less than 2000 mg a day.  Discussed daily weights and recording and what to report to physician.  Also discussed follow-up in the outpatient heart failure clinic.  He was given an appointment for June 30 at 9:30 in the morning.  Tresa Endo RN [facility]

## 2021-05-30 LAB — CBC WITH DIFFERENTIAL/PLATELET
Abs Immature Granulocytes: 0.02 10*3/uL (ref 0.00–0.07)
Basophils Absolute: 0.1 10*3/uL (ref 0.0–0.1)
Basophils Relative: 1 %
Eosinophils Absolute: 0.1 10*3/uL (ref 0.0–0.5)
Eosinophils Relative: 2 %
HCT: 39.9 % (ref 39.0–52.0)
Hemoglobin: 14.1 g/dL (ref 13.0–17.0)
Immature Granulocytes: 0 %
Lymphocytes Relative: 25 %
Lymphs Abs: 1.8 10*3/uL (ref 0.7–4.0)
MCH: 31.3 pg (ref 26.0–34.0)
MCHC: 35.3 g/dL (ref 30.0–36.0)
MCV: 88.7 fL (ref 80.0–100.0)
Monocytes Absolute: 0.9 10*3/uL (ref 0.1–1.0)
Monocytes Relative: 13 %
Neutro Abs: 4.1 10*3/uL (ref 1.7–7.7)
Neutrophils Relative %: 59 %
Platelets: 237 10*3/uL (ref 150–400)
RBC: 4.5 MIL/uL (ref 4.22–5.81)
RDW: 12.6 % (ref 11.5–15.5)
WBC: 7 10*3/uL (ref 4.0–10.5)
nRBC: 0 % (ref 0.0–0.2)

## 2021-05-30 LAB — BASIC METABOLIC PANEL
Anion gap: 9 (ref 5–15)
BUN: 21 mg/dL (ref 8–23)
CO2: 24 mmol/L (ref 22–32)
Calcium: 8.7 mg/dL — ABNORMAL LOW (ref 8.9–10.3)
Chloride: 100 mmol/L (ref 98–111)
Creatinine, Ser: 0.9 mg/dL (ref 0.61–1.24)
GFR, Estimated: >60 mL/min (ref >60)
Glucose, Bld: 111 mg/dL — ABNORMAL HIGH (ref 70–99)
Potassium: 3.5 mmol/L (ref 3.5–5.1)
Sodium: 133 mmol/L — ABNORMAL LOW (ref 135–145)

## 2021-05-30 LAB — BRAIN NATRIURETIC PEPTIDE: B Natriuretic Peptide: 163.7 pg/mL — ABNORMAL HIGH (ref 0.0–100.0)

## 2021-05-30 LAB — MAGNESIUM: Magnesium: 1.9 mg/dL (ref 1.7–2.4)

## 2021-05-30 MED ORDER — METOPROLOL SUCCINATE ER 25 MG PO TB24: 25.0000 mg | ORAL_TABLET | Freq: Every day | ORAL | 0 refills | Status: AC

## 2021-05-30 NOTE — Progress Notes (Signed)
CSW consulted for heart failure screen, this was completed by heart failure RN Jimsey.   No needs for discharge identified at this time.   [place], [place] [phone]

## 2021-05-30 NOTE — Discharge Summary (Addendum)
Physician Discharge Summary  Patient ID: XXAVIER NOON MRN: [id number] DOB/AGE: Mar 20, 1961 60 y.o.  Admit date: 05/28/2021 Discharge date: 05/30/2021  Admission Diagnoses:  Discharge Diagnoses:  Principal Problem:   Acute respiratory failure (HCC) Active Problems:   CAP (community acquired pneumonia)   Acute CHF (congestive heart failure) (HCC)   Hypertensive urgency   Elevated troponin Sepsis ruled out.   Discharged Condition: good  Hospital Course:  JAYCEN VERCHER is a 60 y.o. male with no significant past medical history presents to the emergency room for evaluation of a 4-day history of shortness of breath mostly with exertion associated with cough productive of gray phlegm and wheezing.  He is negative for COVID.  Chest x-ray showed coarse reticular opacities and airway thickening.  CT angiogram of the chest showed no evidence of PE.  Patchy bilateral pulmonary infiltrates with possible multifocal pneumonia.  Procalcitonin level 0.12, initial BNP 232.  Troponin 82. Patient was placed on antibiotics with Rocephin and Zithromax, also given 40 mg IV Lasix daily.  Patient has been requiring 2 L oxygen.    1. Acute respiratory failure with hypoxemia Bilateral lower lobe multifocal pneumonia. Rhinovirus viral pneumonia Mild elevation of troponin secondary to demand ischemia. Patient condition appears to be secondary to rhinovirus pneumonia.  He was treated with Zithromax and Rocephin for bacterial pneumonia, procalcitonin less than 0.01, no need for additional antibiotics.  He also received diuretics for slight elevation of BNP at the 200s, echocardiogram was entirely normal.  Troponin was 80.  He does not appear to have acute congestive heart failure. At discharge, patient be followed with cardiology in the near future. Patient also had a 1 set of blood culture positive, I discussed with the microbiology lab, appear to be skin contaminant.  Patient clinically does not have  bacteremia.   #2.  Thoracic aortic aneurysm.  Incidental finding on CT scan.  Will follow with PCP for yearly CT imaging study.  #3.  Hypertension urgency. Patient blood pressure was running high when he came to the hospital.  I will continue some metoprolol, he will follow-up with PCP to determine if long-term blood pressure medicines needed.     Consults: None  Significant Diagnostic Studies:  CT ANGIOGRAPHY CHEST WITH CONTRAST   TECHNIQUE: Multidetector CT imaging of the chest was performed using the standard protocol during bolus administration of intravenous contrast. Multiplanar CT image reconstructions and MIPs were obtained to evaluate the vascular anatomy.   CONTRAST:  8mL OMNIPAQUE IOHEXOL 350 MG/ML SOLN IV   COMPARISON:  None   FINDINGS: Cardiovascular: Atherosclerotic calcifications aorta, coronary arteries, and proximal great vessels. Aneurysmal dilatation ascending thoracic aorta 4.1 cm transverse image 50. Upper normal heart size. No significant pericardial effusion. Pulmonary arteries adequately opacified. Scattered respiratory motion artifacts in lower lobes. No definite pulmonary emboli identified.   Mediastinum/Nodes: Esophagus unremarkable. Few scattered normal size mediastinal lymph nodes without thoracic adenopathy. Base of cervical region normal appearance.   Lungs/Pleura: Small BILATERAL pleural effusions greater on RIGHT. Consolidation and dependent atelectasis in lower lobes. Additional patchy infiltrate in lingula in and RIGHT middle lobe, less in the upper lobes bilaterally. Central peribronchial thickening. No pneumothorax.   Upper Abdomen: Unremarkable   Musculoskeletal: Unremarkable   Review of the MIP images confirms the above findings.   IMPRESSION: No definite evidence of pulmonary embolism, though assessment of lower lobe pulmonary arteries is suboptimal due to respiratory motion artifacts.   Patchy BILATERAL pulmonary  infiltrates favoring multifocal pneumonia.   Small BILATERAL pleural effusions  larger on RIGHT.   Aneurysmal dilatation ascending thoracic aorta 4.1 cm diameter, recommendation below.   Recommend annual imaging followup by CTA or MRA. This recommendation follows 2010 ACCF/AHA/AATS/ACR/ASA/SCA/SCAI/SIR/STS/SVM Guidelines for the Diagnosis and Management of Patients with Thoracic Aortic Disease. Circulation. 2010; 121: M578-I696. Aortic aneurysm NOS (ICD10-I71.9)   Aortic Atherosclerosis (ICD10-I70.0) and Aortic aneurysm NOS (ICD10-I71.9).     Electronically Signed   By: Ulyses Southward M.D.   On: 05/28/2021 08:28  Echo: 1. Left ventricular ejection fraction, by estimation, is 55 to 60%. The left ventricle has normal function. The left ventricle has no regional wall motion abnormalities. There is mild left ventricular hypertrophy. Left ventricular diastolic parameters were normal. 2. Right ventricular systolic function is normal. The right ventricular size is normal. 3. The mitral valve is normal in structure. No evidence of mitral valve regurgitation. 4. The aortic valve was not well visualized. Aortic valve regurgitation is not visualized. 5. The inferior vena cava is normal in size with greater than 50% respiratory variability, suggesting right atrial pressure of 3 mmHg.    Treatments:  Antibiotics  Discharge Exam: Blood pressure 128/83, pulse 81, temperature 97.8 F (36.6 C), resp. rate 17, height 6\' 2"  (1.88 m), weight 121.6 kg, SpO2 96 %. General appearance: alert and cooperative Resp: clear to auscultation bilaterally Cardio: regular rate and rhythm, S1, S2 normal, no murmur, click, rub or gallop GI: soft, non-tender; bowel sounds normal; no masses,  no organomegaly Extremities: extremities normal, atraumatic, no cyanosis or edema  Disposition: Discharge disposition: 01-Home or Self Care       Discharge Instructions     Diet - low sodium heart healthy    Complete by: As directed    Increase activity slowly   Complete by: As directed       Allergies as of 05/30/2021   No Known Allergies      Medication List     STOP taking these medications    ondansetron 4 MG disintegrating tablet Commonly known as: ZOFRAN-ODT   oxyCODONE-acetaminophen 5-325 MG tablet Commonly known as: PERCOCET/ROXICET   tamsulosin 0.4 MG Caps capsule Commonly known as: FLOMAX       TAKE these medications    metoprolol succinate 25 MG 24 hr tablet Commonly known as: TOPROL-XL Take 1 tablet (25 mg total) by mouth daily. Start taking on: May 31, 2021        Follow-up Information     June 02, 2021, MD Follow up in 1 week(s).   Specialty: Family Medicine Contact information: [address] [place] [phone]         [phone], MD Follow up in 4 week(s).   Specialty: Cardiology Contact information: [address] [place] [phone]                35 minutes Signed: [phone] 05/30/2021, 10:21 AM

## 2021-05-31 LAB — CULTURE, BLOOD (SINGLE): Special Requests: ADEQUATE

## 2021-06-02 LAB — CULTURE, BLOOD (SINGLE)
Culture: NO GROWTH
Special Requests: ADEQUATE

## 2021-06-04 ENCOUNTER — Telehealth: Payer: Self-pay

## 2021-06-12 NOTE — Progress Notes (Signed)
Patient ID: Philip Romero, male    DOB: Oct 18, 1961, 60 y.o.   MRN: [id number]  HPI  Philip Romero is a 60 y/o male with a history of HTN, CKD and chronic heart failure.   Echo report from 05/28/21 reviewed and showed an EF of 55-60% along with mild LVH.   Admitted 05/28/21 due to shortness of breath due to HF and pneumonia. Antibiotics given for pneumonia. Elevated troponin thought to be due to demand ischemia. Initially given IV lasix with transition to oral diuretics. Chest CT negative for PE. Discharged after 2 days.   He presents today for his initial visit with a chief complaint of productive cough. He describes this as having been present for several weeks although has improved since prior to his recent admission. He has associated head congestion along with this.He denies any difficulty sleeping, dizziness, abdominal distention, palpitations, pedal edema, chest pain, shortness of breath or fatigue.   Does weigh himself at work.   Past Medical History:  Diagnosis Date   CHF (congestive heart failure) (HCC)    Hypertension    Kidney stone    Past Surgical History:  Procedure Laterality Date   EXTRACORPOREAL SHOCK WAVE LITHOTRIPSY Right 03/01/2020   Procedure: EXTRACORPOREAL SHOCK WAVE LITHOTRIPSY (ESWL);  Surgeon: [name], MD;  Location: [facility] ORS;  Service: Urology;  Laterality: Right;   LITHOTRIPSY     LUMBAR DISC SURGERY     Family History  Problem Relation Age of Onset   Diabetes Mellitus II Mother    Dementia Father    Social History   Tobacco Use   Smoking status: Never   Smokeless tobacco: Current    Types: Chew  Substance Use Topics   Alcohol use: Not Currently   No Known Allergies Prior to Admission medications   Medication Sig Start Date End Date Taking? Authorizing Provider  metoprolol succinate (TOPROL-XL) 25 MG 24 hr tablet Take 1 tablet (25 mg total) by mouth daily. 05/31/21  Yes Marrion Coy, MD    Review of Systems  Constitutional:  Negative for  appetite change and fatigue.  HENT:  Positive for congestion. Negative for postnasal drip and sore throat.   Eyes: Negative.   Respiratory:  Positive for cough. Negative for shortness of breath.   Cardiovascular:  Negative for chest pain, palpitations and leg swelling.  Gastrointestinal:  Negative for abdominal distention and abdominal pain.  Endocrine: Negative.   Genitourinary: Negative.   Musculoskeletal:  Negative for back pain and neck pain.  Skin: Negative.   Allergic/Immunologic: Negative.   Neurological:  Negative for dizziness and light-headedness.  Hematological:  Negative for adenopathy. Does not bruise/bleed easily.  Psychiatric/Behavioral:  Negative for dysphoric mood and sleep disturbance (sleeping on 1 pillow). The patient is not nervous/anxious.    Vitals:   06/13/21 0935  BP: (!) 151/93  Pulse: 68  Resp: 18  SpO2: 99%  Weight: 261 lb 8 oz (118.6 kg)  Height: 6\' 2"  (1.88 m)   Wt Readings from Last 3 Encounters:  06/13/21 261 lb 8 oz (118.6 kg)  05/30/21 268 lb (121.6 kg)  03/15/20 263 lb (119.3 kg)   Lab Results  Component Value Date   CREATININE 0.90 05/30/2021   CREATININE 0.83 05/29/2021   CREATININE 0.87 05/28/2021    Physical Exam Vitals reviewed.  Constitutional:      Appearance: Normal appearance.  HENT:     Head: Normocephalic and atraumatic.  Cardiovascular:     Rate and Rhythm: Normal rate and regular rhythm.  Pulmonary:     Effort: Pulmonary effort is normal. No respiratory distress.     Breath sounds: No wheezing or rales.  Abdominal:     General: There is no distension.     Palpations: Abdomen is soft.  Musculoskeletal:        General: No tenderness.     Cervical back: Normal range of motion and neck supple.     Right lower leg: No edema.     Left lower leg: No edema.  Skin:    General: Skin is warm and dry.  Neurological:     General: No focal deficit present.     Mental Status: He is alert and oriented to person, place, and  time.  Psychiatric:        Mood and Affect: Mood normal.        Behavior: Behavior normal.        Thought Content: Thought content normal.     Assessment & Plan:  1: Chronic heart failure with preserved ejection fraction with structural changes (LVH)- - NYHA class I - euvolemic today - weighing daily at work; reminded to call for an overnight weight gain of > 2 pounds or a weekly weight gain of > 5 pounds - not adding any salt to his food - takes his metoprolol at night so he doesn't forget it - to see cardiology ([facility]) 06/27/21 - BNP 05/30/21 was 163.7  2: HTN- - BP mildly elevated today - to see PCP Terance Hart) later in July - BMP 05/30/21 reviewed and showed sodium 133, potassium 3.5, creatinine 0.9 and GFR >60   Reviewed medication list.  Due to HF stability and no current symptoms, will not make a return appointment for patient at this time. Advised him that he could call back at anytime for any questions or to make another appointment and he was comfortable with this plan.

## 2021-06-13 ENCOUNTER — Encounter: Payer: Self-pay | Admitting: Family

## 2021-06-13 ENCOUNTER — Ambulatory Visit: Payer: PRIVATE HEALTH INSURANCE | Attending: Family | Admitting: Family

## 2021-06-13 ENCOUNTER — Other Ambulatory Visit: Payer: Self-pay

## 2021-06-13 VITALS — BP 151/93 | HR 68 | Resp 18 | Ht 74.0 in | Wt 261.5 lb

## 2021-06-13 DIAGNOSIS — I10 Essential (primary) hypertension: Secondary | ICD-10-CM

## 2021-06-13 DIAGNOSIS — I5032 Chronic diastolic (congestive) heart failure: Secondary | ICD-10-CM

## 2021-06-13 DIAGNOSIS — I5022 Chronic systolic (congestive) heart failure: Secondary | ICD-10-CM | POA: Insufficient documentation

## 2021-06-13 DIAGNOSIS — N189 Chronic kidney disease, unspecified: Secondary | ICD-10-CM | POA: Insufficient documentation

## 2021-06-13 DIAGNOSIS — R059 Cough, unspecified: Secondary | ICD-10-CM | POA: Insufficient documentation

## 2021-06-13 DIAGNOSIS — I130 Hypertensive heart and chronic kidney disease with heart failure and stage 1 through stage 4 chronic kidney disease, or unspecified chronic kidney disease: Secondary | ICD-10-CM | POA: Diagnosis present

## 2021-06-13 NOTE — Patient Instructions (Addendum)
Continue weighing daily and call for an overnight weight gain of > 2 pounds or a weekly weight gain of >5 pounds.    Call us in the future if you need us for anything  

## 2021-06-14 ENCOUNTER — Ambulatory Visit: Payer: No Typology Code available for payment source | Admitting: Family

## 2021-06-27 ENCOUNTER — Other Ambulatory Visit: Payer: Self-pay | Admitting: Cardiology

## 2021-06-27 DIAGNOSIS — I712 Thoracic aortic aneurysm, without rupture, unspecified: Secondary | ICD-10-CM

## 2021-07-26 IMAGING — CT CT RENAL STONE PROTOCOL
2 of 4 series · 16 of 46 positions shown, 18 images · non-contrast
Comparison: None.

CLINICAL DATA: Right-sided flank pain.

EXAM:
CT ABDOMEN AND PELVIS WITHOUT CONTRAST
TECHNIQUE: Multidetector CT imaging of the abdomen and pelvis was performed
following the standard protocol without IV contrast.

[Series 2: stone full standard · axial · 0.89mm/px · z∈[-594,-104]mm · 13 of 108 slices shown, 15 images]
[im 5/108  soft-tissue]
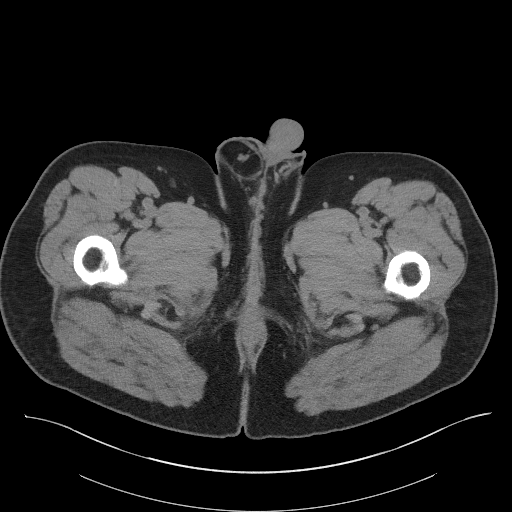
[im 5/108  bone]
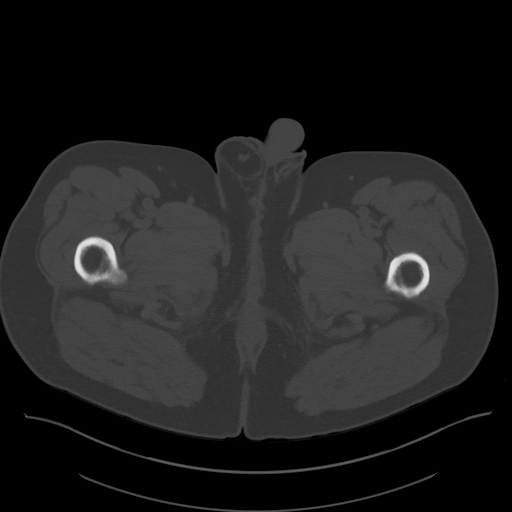
[im 14/108  soft-tissue]
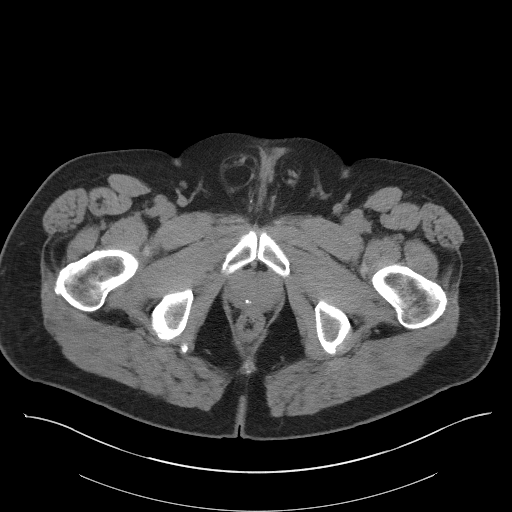
[im 24/108  soft-tissue]
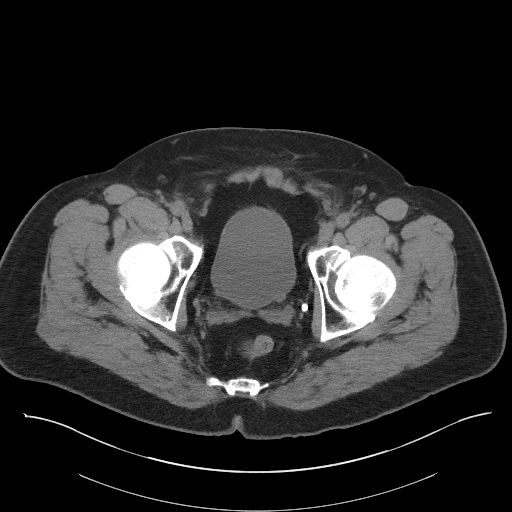
[im 28/108  soft-tissue]
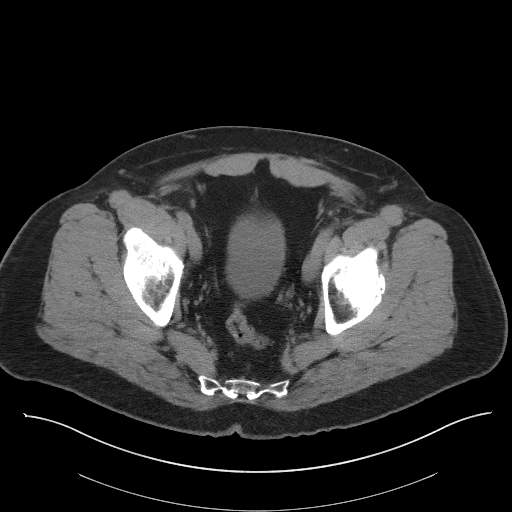
[im 38/108  soft-tissue]
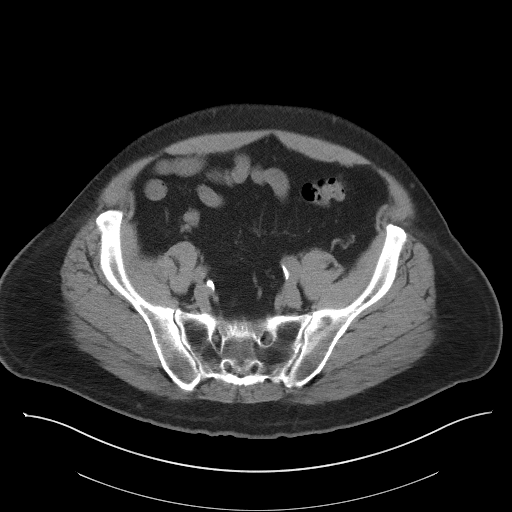
[im 47/108  soft-tissue]
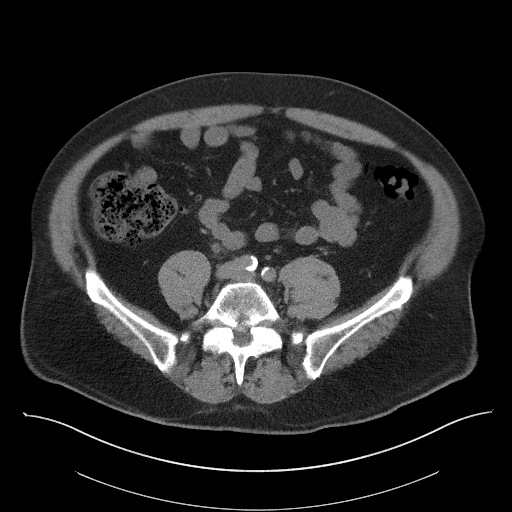
[im 56/108  soft-tissue]
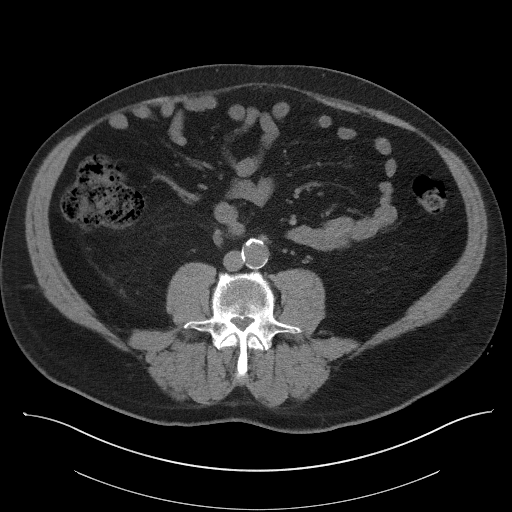
[im 61/108  soft-tissue]
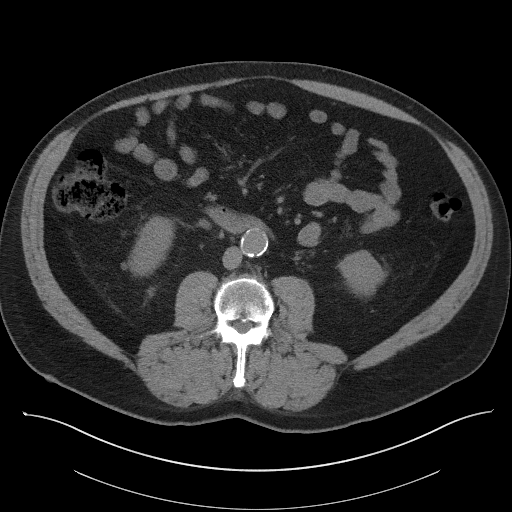
[im 70/108  soft-tissue]
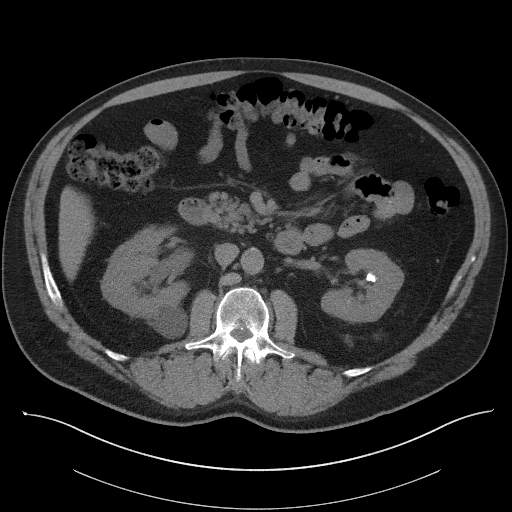
[im 70/108  bone]
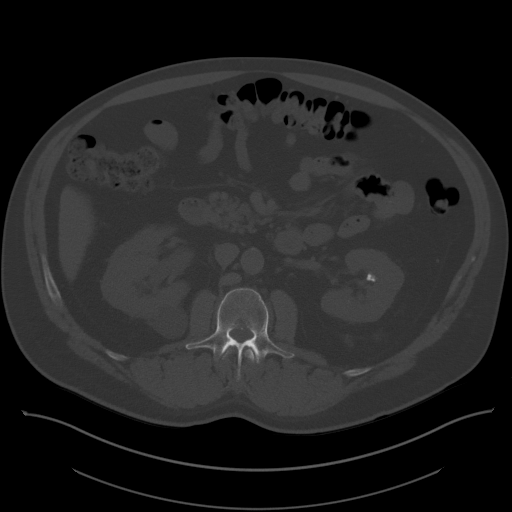
[im 80/108  soft-tissue]
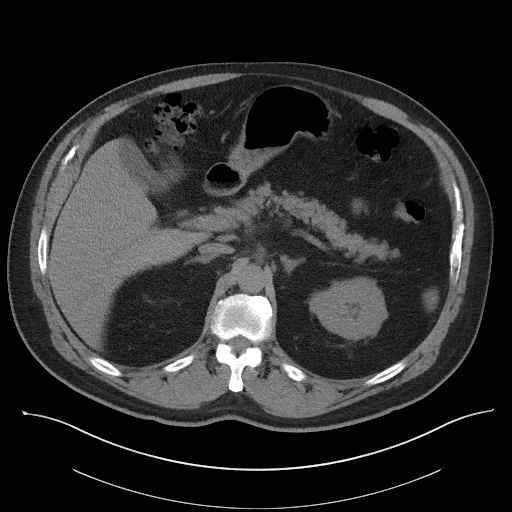
[im 84/108  soft-tissue]
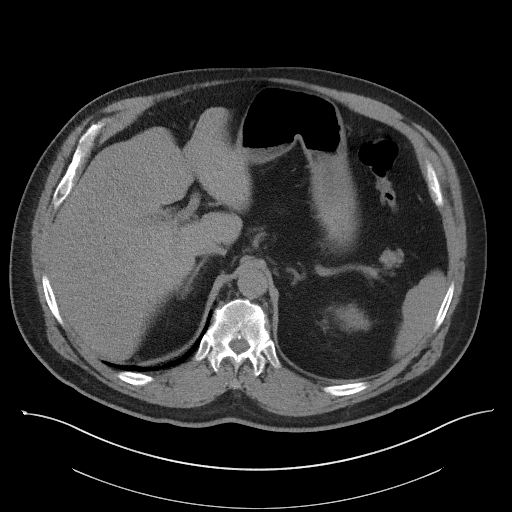
[im 94/108  soft-tissue]
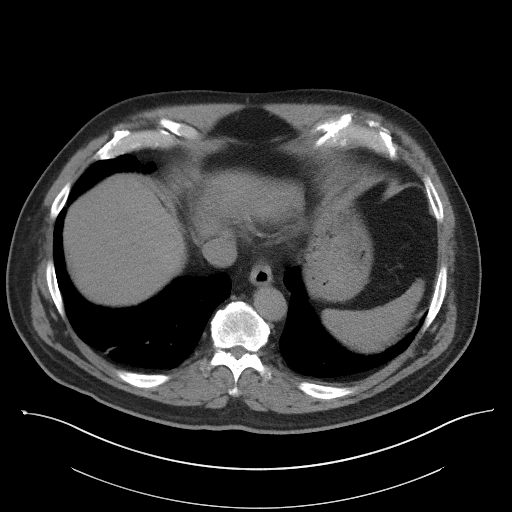
[im 103/108  soft-tissue]
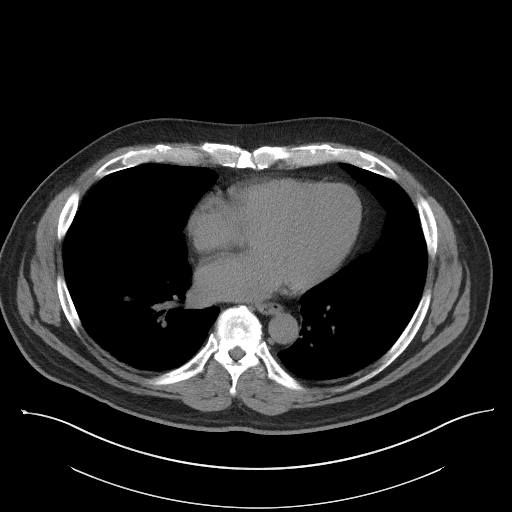

[Series 5: coronal · coronal · 0.98mm/px · 3 of 171 slices shown]
[im 57/171  soft-tissue]
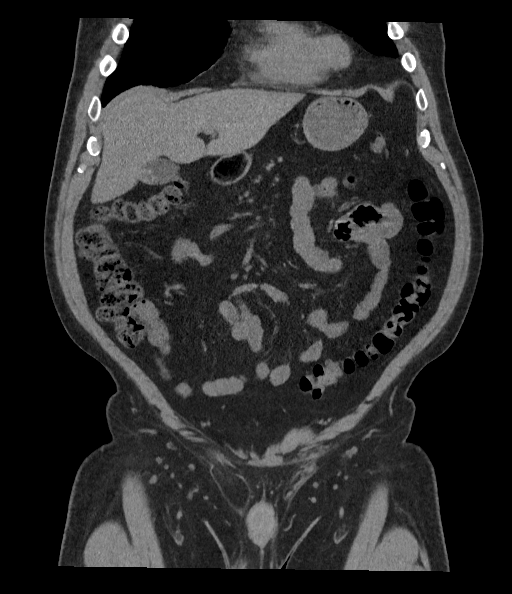
[im 76/171  soft-tissue]
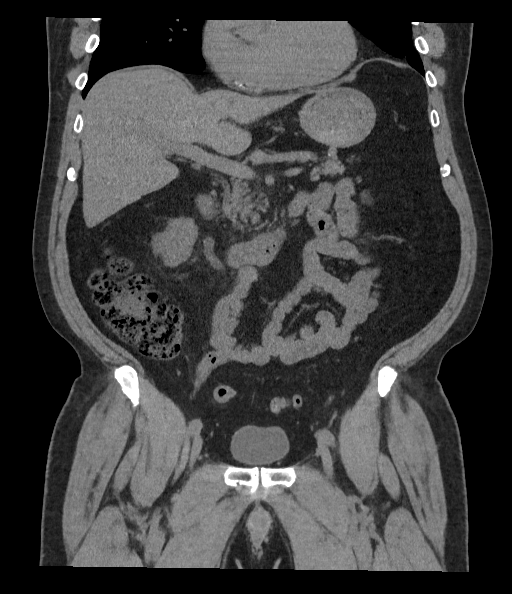
[im 95/171  soft-tissue]
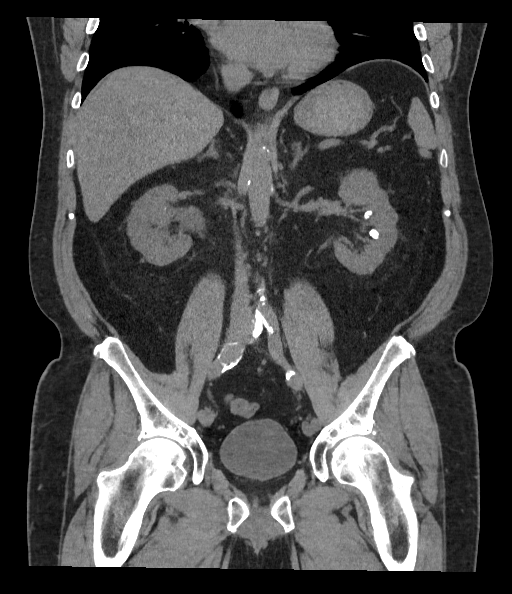

[16 of 46 positions shown; findings below may reference images not displayed]

FINDINGS: Lower chest: The lung bases are clear. The heart size is normal.

Hepatobiliary: There is decreased hepatic attenuation suggestive of
hepatic steatosis. Cholelithiasis without acute inflammation.There
is no biliary ductal dilation.

Pancreas: Normal contours without ductal dilatation. No
peripancreatic fluid collection.

Spleen: No splenic laceration or hematoma.

Adrenals/Urinary Tract:

--Adrenal glands: No adrenal hemorrhage.

--Right kidney/ureter: There is mild to moderate right-sided
hydronephrosis secondary to an obstructing stone in the mid right
ureter measuring approximately 6 mm (axial series 2, image 59).
There are additional nonobstructing stones in the right kidney
measuring up to approximately 6 mm in the lower pole.

--Left kidney/ureter: Multiple nonobstructing stones are noted in
the left kidney measuring up to approximately 1.3 cm.

--Urinary bladder: Unremarkable.

Stomach/Bowel:

--Stomach/Duodenum: No hiatal hernia or other gastric abnormality.
Normal duodenal course and caliber.

--Small bowel: No dilatation or inflammation.

--Colon: There is scattered colonic diverticula without CT evidence
of diverticulitis.

--Appendix: Normal.

Vascular/Lymphatic: Atherosclerotic calcification is present within
the non-aneurysmal abdominal aorta, without hemodynamically
significant stenosis.

--No retroperitoneal lymphadenopathy.

--No mesenteric lymphadenopathy.

--No pelvic or inguinal lymphadenopathy.

Reproductive: Unremarkable

Other: No ascites or free air. There are bilateral fat containing
inguinal hernias.

Musculoskeletal. No acute displaced fractures.
IMPRESSION: 1. Moderate right-sided hydronephrosis secondary to an obstructing 6
mm stone in the mid right ureter.
2. Bilateral nonobstructing nephrolithiasis as detailed above.
3. Hepatic steatosis.
4. Scattered colonic diverticula without CT evidence for
diverticulitis. Normal appendix in the right lower quadrant.
5.  Aortic Atherosclerosis (MXUFR-E07.7).

## 2021-12-30 ENCOUNTER — Other Ambulatory Visit: Payer: PRIVATE HEALTH INSURANCE

## 2022-01-02 ENCOUNTER — Ambulatory Visit: Payer: PRIVATE HEALTH INSURANCE

## 2022-01-23 ENCOUNTER — Ambulatory Visit
Admission: RE | Admit: 2022-01-23 | Discharge: 2022-01-23 | Disposition: A | Discharge status: Home / Self Care | Payer: 59 | Source: Ambulatory Visit | Attending: Cardiology | Admitting: Cardiology

## 2022-01-23 ENCOUNTER — Other Ambulatory Visit: Payer: Self-pay

## 2022-01-23 DIAGNOSIS — I712 Thoracic aortic aneurysm, without rupture, unspecified: Secondary | ICD-10-CM | POA: Insufficient documentation

## 2022-01-23 DIAGNOSIS — J9811 Atelectasis: Secondary | ICD-10-CM | POA: Diagnosis not present

## 2022-01-23 DIAGNOSIS — N281 Cyst of kidney, acquired: Secondary | ICD-10-CM | POA: Diagnosis not present

## 2022-01-23 LAB — POCT I-STAT CREATININE: Creatinine, Ser: 0.9 mg/dL (ref 0.61–1.24)

## 2022-01-23 MED ORDER — IOHEXOL 350 MG/ML SOLN
80.0000 mL | Freq: Once | INTRAVENOUS | Status: AC | PRN
  Administered 2022-01-23: 80 mL via INTRAVENOUS

## 2022-07-01 DIAGNOSIS — Z131 Encounter for screening for diabetes mellitus: Secondary | ICD-10-CM | POA: Diagnosis not present

## 2022-07-01 DIAGNOSIS — Z130 Encounter for screening for diseases of the blood and blood-forming organs and certain disorders involving the immune mechanism: Secondary | ICD-10-CM | POA: Diagnosis not present

## 2022-07-01 DIAGNOSIS — R972 Elevated prostate specific antigen [PSA]: Secondary | ICD-10-CM | POA: Diagnosis not present

## 2022-07-01 DIAGNOSIS — Z13220 Encounter for screening for lipoid disorders: Secondary | ICD-10-CM | POA: Diagnosis not present

## 2022-07-08 DIAGNOSIS — Z0000 Encounter for general adult medical examination without abnormal findings: Secondary | ICD-10-CM | POA: Diagnosis not present

## 2022-07-08 DIAGNOSIS — E785 Hyperlipidemia, unspecified: Secondary | ICD-10-CM | POA: Diagnosis not present

## 2022-07-08 DIAGNOSIS — I7121 Aneurysm of the ascending aorta, without rupture: Secondary | ICD-10-CM | POA: Diagnosis not present

## 2022-07-08 DIAGNOSIS — E119 Type 2 diabetes mellitus without complications: Secondary | ICD-10-CM | POA: Diagnosis not present

## 2022-07-08 DIAGNOSIS — Z1331 Encounter for screening for depression: Secondary | ICD-10-CM | POA: Diagnosis not present

## 2022-07-08 DIAGNOSIS — K420 Umbilical hernia with obstruction, without gangrene: Secondary | ICD-10-CM | POA: Diagnosis not present

## 2022-07-08 DIAGNOSIS — Z636 Dependent relative needing care at home: Secondary | ICD-10-CM | POA: Diagnosis not present

## 2022-07-08 DIAGNOSIS — I10 Essential (primary) hypertension: Secondary | ICD-10-CM | POA: Diagnosis not present

## 2022-07-08 DIAGNOSIS — E1169 Type 2 diabetes mellitus with other specified complication: Secondary | ICD-10-CM | POA: Diagnosis not present

## 2022-07-08 DIAGNOSIS — Z6835 Body mass index (BMI) 35.0-35.9, adult: Secondary | ICD-10-CM | POA: Diagnosis not present

## 2022-10-23 IMAGING — CR DG CHEST 2V
1 series · 2 of 2 positions shown · non-contrast
Comparison: None.

CLINICAL DATA: Shortness of breath

EXAM:
CHEST - 2 VIEW

[Series 1: dg chest 2 view · 0.14mm/px · 2 of 2 slices shown]
[im 1/2]
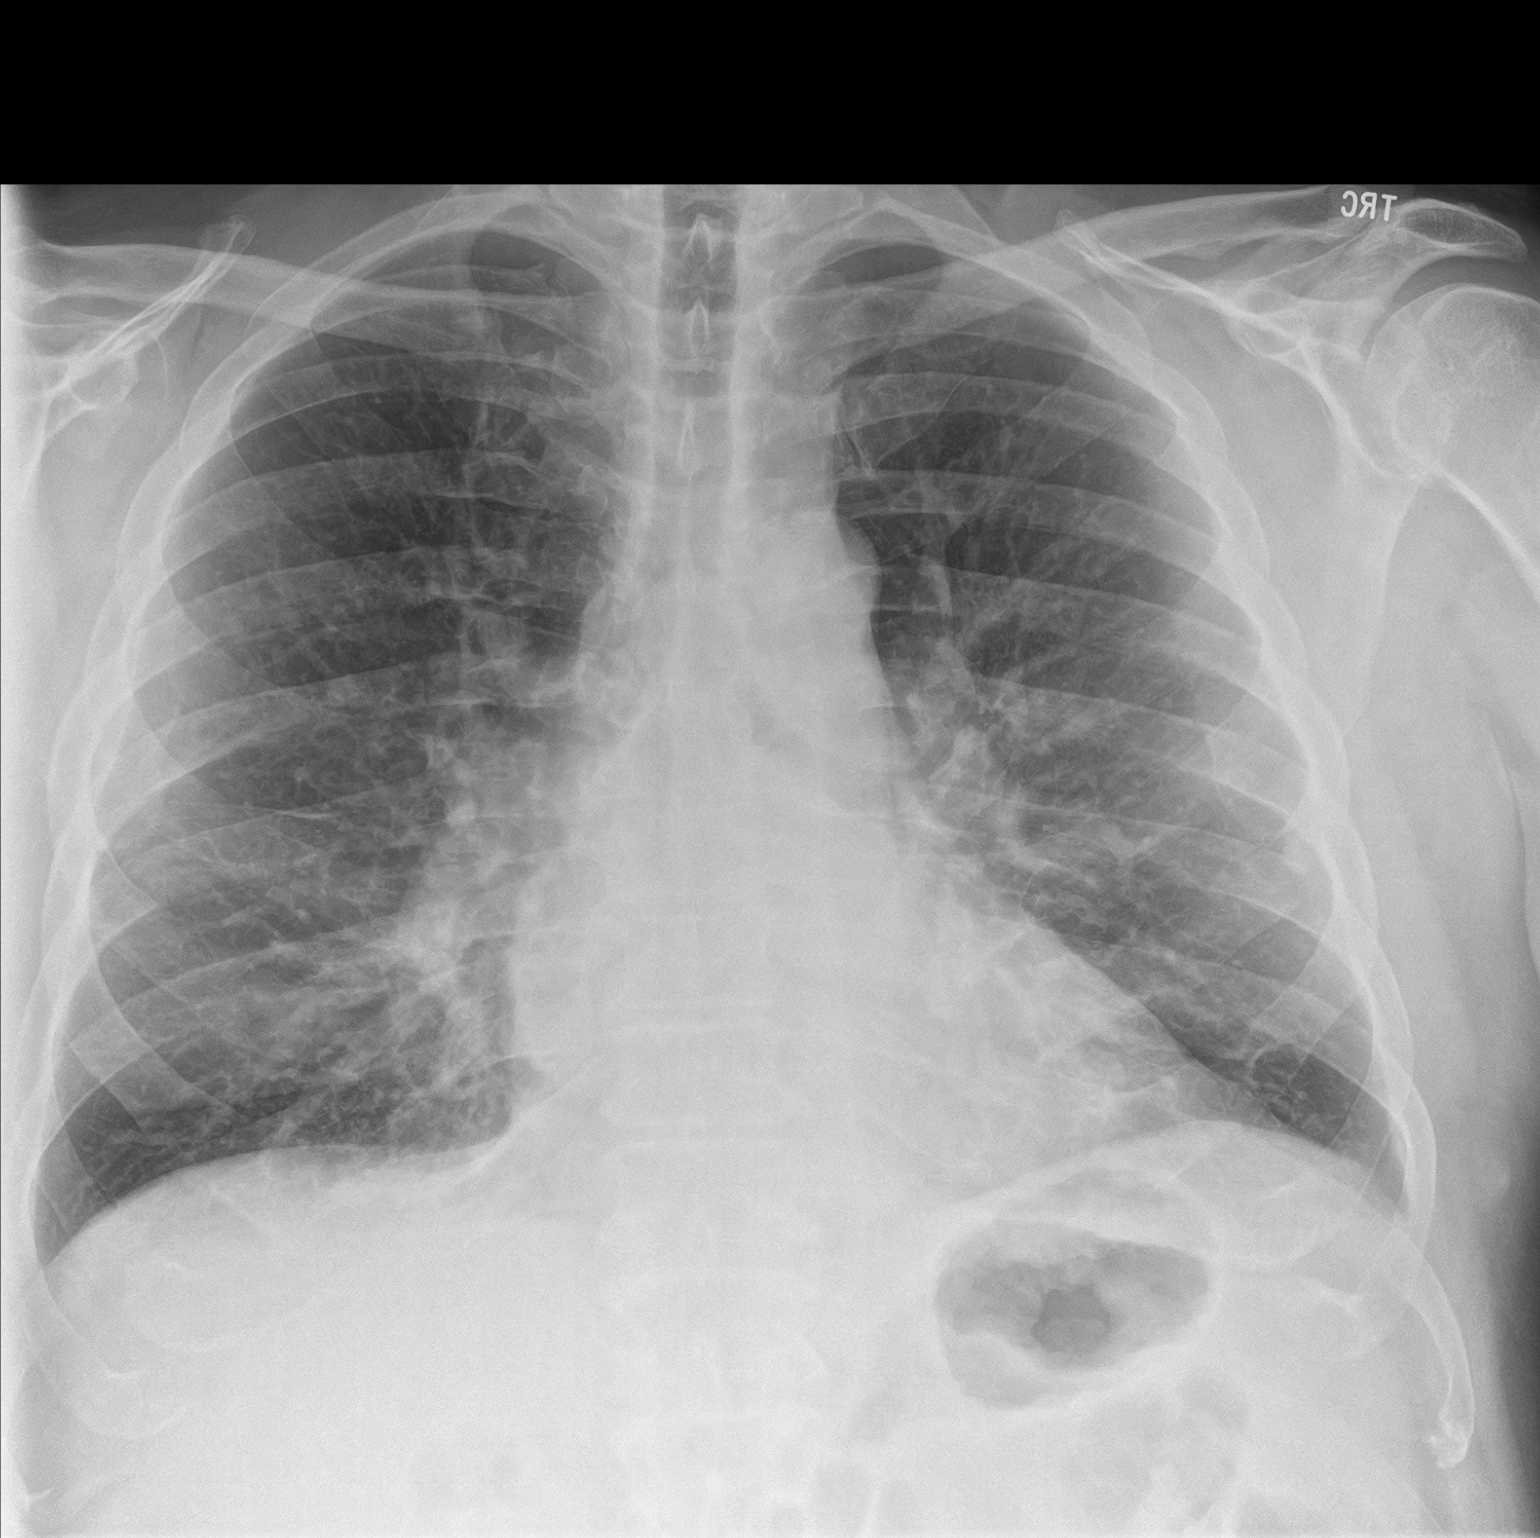
[im 2/2]
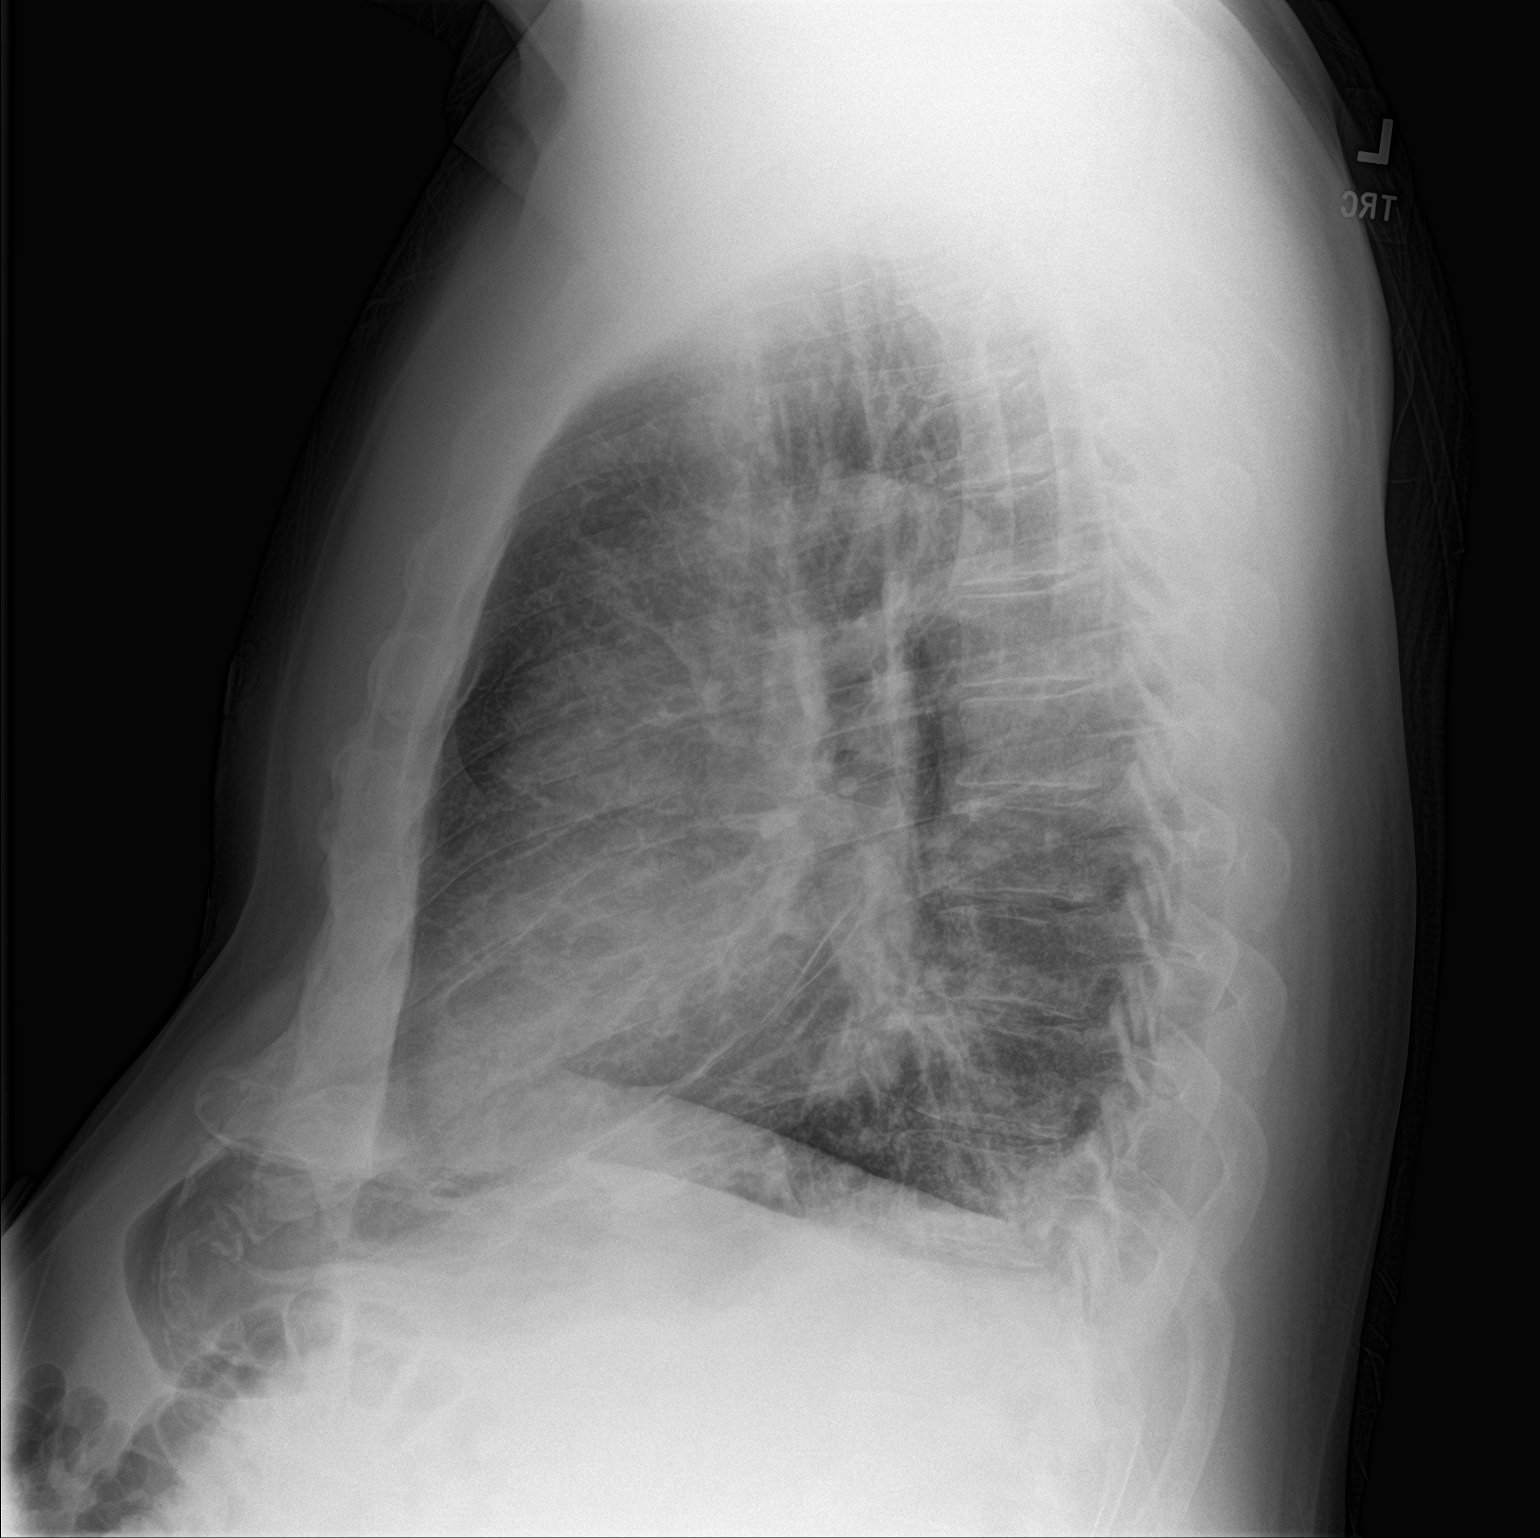

[2 of 2 positions shown; findings below may reference images not displayed]

FINDINGS: Some coarse reticular opacities and airways thickening are present.
Suspect small right pleural effusion. No left effusion or
pneumothorax. The aorta is calcified. The remaining
cardiomediastinal contours are unremarkable. No acute osseous or
soft tissue abnormality. Degenerative changes are present in the
imaged spine and shoulders.
IMPRESSION: Coarse reticular opacities and airways thickening, could reflect
atypical infection or developing edema in the appropriate clinical
setting.

Trace right effusion.

Aortic Atherosclerosis ([id number].R).

## 2023-10-09 ENCOUNTER — Other Ambulatory Visit
Admission: RE | Admit: 2023-10-09 | Discharge: 2023-10-09 | Disposition: A | Discharge status: Home / Self Care | Payer: 59 | Attending: Urology | Admitting: Urology

## 2023-10-09 ENCOUNTER — Encounter: Payer: Self-pay | Admitting: Urology

## 2023-10-09 ENCOUNTER — Ambulatory Visit: Payer: 59 | Admitting: Urology

## 2023-10-09 VITALS — BP 125/80 | HR 90 | Ht 74.0 in | Wt 270.0 lb

## 2023-10-09 DIAGNOSIS — N529 Male erectile dysfunction, unspecified: Secondary | ICD-10-CM

## 2023-10-09 DIAGNOSIS — E291 Testicular hypofunction: Secondary | ICD-10-CM | POA: Insufficient documentation

## 2023-10-09 LAB — HEPATIC FUNCTION PANEL
ALT: 23 U/L (ref 0–44)
AST: 25 U/L (ref 15–41)
Albumin: 4.5 g/dL (ref 3.5–5.0)
Alkaline Phosphatase: 61 U/L (ref 38–126)
Bilirubin, Direct: 0.1 mg/dL (ref 0.0–0.2)
Indirect Bilirubin: 1.1 mg/dL — ABNORMAL HIGH (ref 0.3–0.9)
Total Bilirubin: 1.2 mg/dL (ref 0.3–1.2)
Total Protein: 7.9 g/dL (ref 6.5–8.1)

## 2023-10-09 LAB — PSA: Prostatic Specific Antigen: 0.52 ng/mL (ref 0.00–4.00)

## 2023-10-09 MED ORDER — TADALAFIL 5 MG PO TABS: 5.0000 mg | ORAL_TABLET | Freq: Every day | ORAL | 11 refills | Status: AC | PRN

## 2023-10-09 MED ORDER — CLOMID 50 MG PO TABS: 25.0000 mg | ORAL_TABLET | Freq: Every day | ORAL | 5 refills | Status: AC

## 2023-10-09 NOTE — Progress Notes (Signed)
Marcelle Overlie Plume,acting as a scribe for [name], MD.,have documented all relevant documentation on the behalf of [name], MD,as directed by  [name], MD while in the presence of [name], MD.  10/09/2023 2:23 PM   [name] 1961/12/04 [id number]  Referring provider: Cyndia Diver, PA-C [address] [place],  [place] [address]  Chief Complaint  Patient presents with   Hypogonadism    HPI: 62 year-old male with hypogonadism.   He presented to his PA on 07/29/2023 complaining of energy levels "plummeting". He had no new stressors. He did not have erectile dysfunction or low libido. He was concerned about his testosterone despite this. He had his testosterone checked and it was 194 on 07/29/2023. It was rechecked and noted to be 201 on 08/25/2023. His FSH and LH were within normal limits.   Today, he reports a decreased sex drive, lack of energy, decreased strength and endurance, decreased enjoyment in life and less strong erections, but otherwise has no other symptoms related to low testosterone. He reports issues with maintaining erections since May, although achieving erections is not a problem.     Androgen Deficiency in the Aging Male     Row Name 10/09/23 1400         Androgen Deficiency in the Aging Male   Do you have a decrease in libido (sex drive) Yes     Do you have lack of energy Yes     Do you have a decrease in strength and/or endurance Yes     Have you lost height No     Have you noticed a decreased "enjoyment of life" Yes     Are you sad and/or grumpy No     Are your erections less strong Yes     Have you noticed a recent deterioration in your ability to play sports No     Are you falling asleep after dinner No     Has there been a recent deterioration in your work performance No               PMH: Past Medical History:  Diagnosis Date   CHF (congestive heart failure) (HCC)    Hypertension    Kidney stone      Surgical History: Past Surgical History:  Procedure Laterality Date   EXTRACORPOREAL SHOCK WAVE LITHOTRIPSY Right 03/01/2020   Procedure: EXTRACORPOREAL SHOCK WAVE LITHOTRIPSY (ESWL);  Surgeon: [name], MD;  Location: [facility] ORS;  Service: Urology;  Laterality: Right;   LITHOTRIPSY     LUMBAR DISC SURGERY      Home Medications:  Allergies as of 10/09/2023   No Known Allergies      Medication List        Accurate as of October 09, 2023  2:23 PM. If you have any questions, ask your nurse or doctor.          losartan 25 MG tablet Commonly known as: COZAAR Take 25 mg by mouth daily.   metoprolol succinate 25 MG 24 hr tablet Commonly known as: TOPROL-XL Take 1 tablet (25 mg total) by mouth daily.   rosuvastatin 10 MG tablet Commonly known as: CRESTOR Take 10 mg by mouth at bedtime.        Family History: Family History  Problem Relation Age of Onset   Diabetes Mellitus II Mother    Dementia Father     Social History:  reports that he has never smoked. His smokeless tobacco use includes chew.  He reports that he does not currently use alcohol. He reports that he does not currently use drugs.   Physical Exam: BP 125/80 (BP Location: Left Arm, Patient Position: Sitting, Cuff Size: Large)   Pulse 90   Ht 6\' 2"  (1.88 m)   Wt 270 lb (122.5 kg)   BMI 34.67 kg/m   Constitutional:  Alert and oriented, No acute distress. HEENT: [place] AT, moist mucus membranes.  Trachea midline, no masses. Neurologic: Grossly intact, no focal deficits, moving all 4 extremities. Psychiatric: Normal mood and affect.   Assessment & Plan:    1. Hypogonadism - Low testosterone levels confirmed on two separate occasions 194 on 07/29/2023 and 201 on 08/25/2023 - Clomid 25 mg daily. Discuss the potential need for ongoing monitoring and lab work. - Order PSA screening and pituitary function tests to rule out secondary causes of hypogonadism, such as a pituitary tumor. -If fails to  respond to not improved with Clomid, may consider exogenous testosterone  2. Erectile dysfunction - Mild erectile dysfunction, likely multifactorial, possibly related to both low testosterone and age-related vascular changes. - Cialis 5 mg daily for erectile dysfunction. Instruct the patient to use a GoodRx coupon for cost savings.  - Follow up on the effectiveness of the treatment.  Consider on demand dosing if the above fails   Return in about 3 months (around 01/09/2024) for assessment of medication efficacy with labs completed 1 week prior .  I have reviewed the above documentation for accuracy and completeness, and I agree with the above.   [name], MD    [facility] [address], [address] [place], [place] [address] [phone]

## 2023-10-11 LAB — PROLACTIN: Prolactin: 13.4 ng/mL (ref 3.6–25.2)

## 2023-10-11 LAB — TESTOSTERONE: Testosterone: 173 ng/dL — ABNORMAL LOW (ref 264–916)

## 2023-12-29 ENCOUNTER — Other Ambulatory Visit: Payer: Self-pay | Admitting: *Deleted

## 2023-12-29 DIAGNOSIS — E291 Testicular hypofunction: Secondary | ICD-10-CM

## 2024-01-06 ENCOUNTER — Other Ambulatory Visit: Payer: 59

## 2024-01-06 DIAGNOSIS — E291 Testicular hypofunction: Secondary | ICD-10-CM

## 2024-01-07 LAB — HEPATIC FUNCTION PANEL
ALT: 29 [IU]/L (ref 0–44)
AST: 28 [IU]/L (ref 0–40)
Albumin: 4.5 g/dL (ref 3.9–4.9)
Alkaline Phosphatase: 57 [IU]/L (ref 44–121)
Bilirubin Total: 0.5 mg/dL (ref 0.0–1.2)
Bilirubin, Direct: 0.18 mg/dL (ref 0.00–0.40)
Total Protein: 7.3 g/dL (ref 6.0–8.5)

## 2024-01-07 LAB — TESTOSTERONE: Testosterone: 665 ng/dL (ref 264–916)

## 2024-01-12 ENCOUNTER — Ambulatory Visit: Payer: 59 | Admitting: Urology

## 2024-01-12 ENCOUNTER — Encounter: Payer: Self-pay | Admitting: Urology

## 2024-01-12 DIAGNOSIS — E291 Testicular hypofunction: Secondary | ICD-10-CM | POA: Diagnosis not present

## 2024-01-12 NOTE — Progress Notes (Signed)
I,Amy L Pierron,acting as a scribe for Philip Scotland, MD.,have documented all relevant documentation on the behalf of Philip Scotland, MD,as directed by  Philip Scotland, MD while in the presence of Philip Scotland, MD.  01/12/2024 11:21 AM   Philip Romero 1961/03/19 [id number]  Referring provider: Dorothey Baseman, MD [phone] [address] [place],  [place] [address]  Chief Complaint  Patient presents with   Follow-up    HPI: 63 year-old male presents today for a follow-up regarding hypogonadism with a testosterone and LFT's.  He was seen and evaluated in October. At that point in time he was known to have low energy levels. His testosterone was 194. His PSA from 10/09/2023 was 0.52. He elected to start on Clomid.  His testosterone level on 01/06/2024 is 665 and his LFT's are normal.   He reports he can definitely see a difference in his attitude and is mindful of having increased aggression and temper since his testosterone is higher.    PMH: Past Medical History:  Diagnosis Date   CHF (congestive heart failure) (HCC)    Hypertension    Kidney stone     Surgical History: Past Surgical History:  Procedure Laterality Date   EXTRACORPOREAL SHOCK WAVE LITHOTRIPSY Right 03/01/2020   Procedure: EXTRACORPOREAL SHOCK WAVE LITHOTRIPSY (ESWL);  Surgeon: Philip Scotland, MD;  Location: [facility] ORS;  Service: Urology;  Laterality: Right;   LITHOTRIPSY     LUMBAR DISC SURGERY      Home Medications:  Allergies as of 01/12/2024   No Known Allergies      Medication List        Accurate as of January 12, 2024 11:21 AM. If you have any questions, ask your nurse or doctor.          Clomid 50 MG tablet Generic drug: clomiPHENE Take 0.5 tablets (25 mg total) by mouth daily.   losartan 25 MG tablet Commonly known as: COZAAR Take 25 mg by mouth daily.   metoprolol succinate 25 MG 24 hr tablet Commonly known as: TOPROL-XL Take 1 tablet (25 mg total) by mouth daily.   rosuvastatin 10  MG tablet Commonly known as: CRESTOR Take 10 mg by mouth at bedtime.   tadalafil 5 MG tablet Commonly known as: CIALIS Take 1 tablet (5 mg total) by mouth daily as needed for erectile dysfunction.        Family History: Family History  Problem Relation Age of Onset   Diabetes Mellitus II Mother    Dementia Father     Social History:  reports that he has never smoked. His smokeless tobacco use includes chew. He reports that he does not currently use alcohol. He reports that he does not currently use drugs.   Physical Exam: BP 119/71   Pulse 64   Ht 6\' 2"  (1.88 m)   Wt 257 lb (116.6 kg)   BMI 33.00 kg/m   Constitutional:  Alert and oriented, No acute distress. HEENT: [place] AT, moist mucus membranes.  Trachea midline, no masses. Neurologic: Grossly intact, no focal deficits, moving all 4 extremities. Psychiatric: Normal mood and affect.   Assessment & Plan:    1. Hypogonadism  - On Clomid. Increased testosterone level is affecting his attitude in a negative way. Explained that oftentimes a large increase in levels is an adjustment for the brain.  - He has requested to lessen his does to 1/4 per day (12.5 mg daily). Agree with this decision. Also mentioned some individuals alternated taking a 1/2 dose and  a 1/4 dose.   - Plan to start with decreasing to 1/4 dose each day. If he feels the dose is too low, or not enough, he will contact the office and we can order a testosterone level lab for him to have done locally.  Return in about 1 year (around 01/11/2025) for PSA, CBC, CMP, testosterone.  I have reviewed the above documentation for accuracy and completeness, and I agree with the above.   Philip Scotland, MD  [facility] [address] [place], [place] [address] [phone]

## 2024-08-09 ENCOUNTER — Encounter: Payer: Self-pay | Admitting: Urology

## 2024-08-11 ENCOUNTER — Other Ambulatory Visit: Payer: Self-pay

## 2024-08-11 NOTE — Addendum Note (Signed)
 Addended byBETHA CORIE PLATER on: 08/11/2024 04:01 PM   Modules accepted: Orders

## 2024-10-31 NOTE — Progress Notes (Signed)
 Established Patient Visit   Chief Complaint: Chief Complaint  Patient presents with   Hypertension   Date of Service: 10/31/2024 Date of Birth: [birth year] PCP: Landy Margaretann Kay, NP  History of Present Illness: Philip Romero is a 63 y.o.male patient who returns for   1.  Essential hypertension  2.  Hyperlipidemia  3.  Moderate coronary atherosclerosis on CT  3.  Mild ascending thoracic aortic aneurysm (3.8 cm) on CT 01/23/2022   Patient previously followed by Dr. Bosie.  He returns today, reports doing well.  The patient denies exertional chest pain or shortness of breath.  He denies palpitations or heart racing.  He denies peripheral edema.  The patient is active but does not exercise regularly.  ECG reveals normal sinus rhythm with right bundle branch block at 66 bpm.  2D echocardiogram 05/28/2021 revealed LVEF 55 to 60%.  Chest CT 01/23/2022 revealed mild ascending thoracic aortic aneurysm 3.8 cm, down from 4.1 cm observed on prior study.  The patient has essential hypertension, blood pressure well-controlled on metoprolol  succinate and losartan, which are well-tolerated without apparent side effects.  The patient follows a low sodium, no added salt diet.  The patient has hyperlipidemia, LDL 60 on 02/02/2024, on rosuvastatin, which is well-tolerated without apparent side effects, followed by his primary care provider.  The patient follows a low-cholesterol, low-fat diet.  Past Medical and Surgical History  Past Medical History Past Medical History:  Diagnosis Date   Allergy    Nephrolithiasis    Non-recurrent unilateral inguinal hernia without obstruction or gangrene 2015    Past Surgical History He has a past surgical history that includes ruptured disc (2005); Inguinal hernia repair (Left, 2016); and vasectomy.   Medications and Allergies  Current Medications  Current Outpatient Medications  Medication Sig Dispense Refill   CLOMID  50 mg tablet Take 25 mg by mouth once daily      losartan (COZAAR) 25 MG tablet Take 1 tablet (25 mg total) by mouth once daily 90 tablet 3   metoprolol  SUCCinate (TOPROL -XL) 25 MG XL tablet TAKE 1/2 TABLET(12.5 MG) BY MOUTH DAILY 45 tablet 3   rosuvastatin (CRESTOR) 10 MG tablet Take 1 tablet (10 mg total) by mouth once daily 90 tablet 3   tadalafiL  (CIALIS ) 5 MG tablet Take 5 mg by mouth once daily as needed     No current facility-administered medications for this visit.    Allergies: Patient has no known allergies.  Social and Family History  Social History  reports that he has never smoked. He has been exposed to tobacco smoke. His smokeless tobacco use includes snuff. He reports that he does not currently use alcohol. He reports that he does not use drugs.  Family History Family History  Problem Relation Name Age of Onset   High blood pressure (Hypertension) Mother     Hyperlipidemia (Elevated cholesterol) Mother     Dementia Mother     Diabetes type II Mother     Dementia Father     Dementia Maternal Grandmother     Bladder Cancer Maternal Grandfather     Diabetes Paternal Grandmother     Dementia Paternal Grandmother      Review of Systems   Review of Systems: The patient denies chest pain, shortness of breath, orthopnea, paroxysmal nocturnal dyspnea, pedal edema, palpitations, heart racing, presyncope, syncope. Review of 12 Systems is negative except as described above.  Physical Examination   Vitals:BP 132/80   Pulse 82   Ht 188 cm (6' 2)  Wt (!) 119.7 kg (264 lb)   SpO2 96%   BMI 33.90 kg/m  Ht:188 cm (6' 2) Wt:(!) 119.7 kg (264 lb) ADJ:Anib surface area is 2.5 meters squared. Body mass index is 33.9 kg/m.  HEENT: Pupils equally reactive to light and accomodation  Neck: Supple without thyromegaly, carotid pulses 2+ Lungs: clear to auscultation bilaterally; no wheezes, rales, rhonchi Heart: Regular rate and rhythm.  No gallops, murmurs or rub Abdomen: soft nontender, nondistended, with  normal bowel sounds Extremities: no cyanosis, clubbing, or edema Peripheral Pulses: 2+ in all extremities, 2+ femoral pulses bilaterally  Assessment   63 y.o. male with  1. Essential hypertension   2. Need for vaccination   3. Aneurysm of ascending aorta without rupture ()   4. Type 2 diabetes mellitus without complication, without long-term current use of insulin (CMS/HHS-HCC)   5. Hyperlipidemia associated with type 2 diabetes mellitus (CMS/HHS-HCC)    63 year old gentleman with essential hypertension, blood pressure well-controlled on current BP medications.  The patient has hyperlipidemia, with LDL cholesterol well-controlled on rosuvastatin.    Plan   1.  Continue current medications 2.  Counseled patient about low-sodium diet 3.  DASH diet printed instructions given to the patient 4.  Counseled patient about low-cholesterol diet 5.  Continue rosuvastatin for hyperlipidemia management 6.  Low fat and cholesterol diet printed instructions given to the patient 7.  Return to clinic for follow-up in 1 year  No orders of the defined types were placed in this encounter.   Return in about 1 year (around 10/31/2025).  PhD [facility] MARSA DOOMS, MD

## 2025-01-09 ENCOUNTER — Ambulatory Visit: Admitting: Urology

## 2025-01-10 ENCOUNTER — Ambulatory Visit: Payer: Self-pay | Admitting: Urology

## 2025-01-12 DIAGNOSIS — E349 Endocrine disorder, unspecified: Secondary | ICD-10-CM | POA: Insufficient documentation

## 2025-01-12 NOTE — Assessment & Plan Note (Signed)
 Hx of low T  - on Clomid  1/4 tablet daily (12.5mg )

## 2025-01-12 NOTE — Progress Notes (Unsigned)
" ° °  01/18/2025 7:50 AM   Philip Romero [birth year] [id number]  Reason for visit: Follow up low T   HPI: 64 y.o. male, initial follow up with me today, previously seen by Dr. Penne in Jan 2025  Prior HPI: Hx of low T  - on Clomid  1/4 tablet daily (12.5mg )     Physical Exam: There were no vitals taken for this visit.   Constitutional:  Alert and oriented, No acute distress.  Laboratory Data: Component Ref Range & Units (hover) 1 yr ago (01/06/24) 1 yr ago (10/09/23)  Testosterone  665 173 Low  CM    Pertinent Imaging: N/A    Assessment & Plan:    Testosterone  deficiency Assessment & Plan: Hx of low T  - on Clomid  1/4 tablet daily (12.5mg )         Penne JONELLE Skye, MD  [facility] [address], [address] [place], [place] [address] [phone] "

## 2025-01-18 ENCOUNTER — Ambulatory Visit: Admitting: Urology

## 2025-01-18 DIAGNOSIS — E349 Endocrine disorder, unspecified: Secondary | ICD-10-CM

## 2025-02-15 ENCOUNTER — Ambulatory Visit: Admitting: Urology
